# Patient Record
Sex: Female | Born: 1965
Health system: Southern US, Community
[De-identification: ages and names within clinical notes are randomized; demographics above are authoritative.]

## PROBLEM LIST (undated history)

## (undated) DIAGNOSIS — Z90710 Acquired absence of both cervix and uterus: Secondary | ICD-10-CM

## (undated) DIAGNOSIS — Z9882 Breast implant status: Secondary | ICD-10-CM

## (undated) DIAGNOSIS — I10 Essential (primary) hypertension: Secondary | ICD-10-CM

## (undated) DIAGNOSIS — M5030 Other cervical disc degeneration, unspecified cervical region: Secondary | ICD-10-CM

## (undated) DIAGNOSIS — F419 Anxiety disorder, unspecified: Secondary | ICD-10-CM

## (undated) HISTORY — DX: Acquired absence of both cervix and uterus: Z90.710

## (undated) HISTORY — DX: Breast implant status: Z98.82

## (undated) HISTORY — DX: Anxiety disorder, unspecified: F41.9

## (undated) HISTORY — PX: CERVICAL FUSION: SHX112

## (undated) HISTORY — PX: ABDOMINAL HYSTERECTOMY: SHX81

## (undated) HISTORY — PX: BREAST ENHANCEMENT SURGERY: SHX7

## (undated) HISTORY — PX: ELBOW SURGERY: SHX618

---

## 2002-08-22 HISTORY — PX: AUGMENTATION MAMMAPLASTY: SUR837

## 2007-01-28 ENCOUNTER — Emergency Department: Payer: Self-pay | Admitting: Emergency Medicine

## 2008-11-26 ENCOUNTER — Ambulatory Visit: Payer: Self-pay | Admitting: Internal Medicine

## 2009-06-18 ENCOUNTER — Emergency Department: Payer: Self-pay | Admitting: Emergency Medicine

## 2010-06-29 ENCOUNTER — Emergency Department: Payer: Self-pay | Admitting: Emergency Medicine

## 2010-07-27 ENCOUNTER — Emergency Department: Payer: Self-pay | Admitting: Emergency Medicine

## 2010-11-17 ENCOUNTER — Ambulatory Visit: Payer: Self-pay | Admitting: Family Medicine

## 2011-01-25 ENCOUNTER — Emergency Department: Payer: Self-pay | Admitting: Emergency Medicine

## 2011-08-25 ENCOUNTER — Ambulatory Visit: Payer: Self-pay | Admitting: Internal Medicine

## 2011-10-07 ENCOUNTER — Ambulatory Visit: Payer: Self-pay | Admitting: Internal Medicine

## 2011-10-07 LAB — URINALYSIS, COMPLETE
Bacteria: NEGATIVE
Blood: NEGATIVE
Glucose,UR: NEGATIVE mg/dL (ref 0–75)
Leukocyte Esterase: NEGATIVE
Nitrite: NEGATIVE
Specific Gravity: 1.005 (ref 1.003–1.030)

## 2011-10-12 ENCOUNTER — Ambulatory Visit: Payer: Self-pay | Admitting: Internal Medicine

## 2012-02-03 DIAGNOSIS — M549 Dorsalgia, unspecified: Secondary | ICD-10-CM | POA: Insufficient documentation

## 2012-02-03 DIAGNOSIS — M542 Cervicalgia: Secondary | ICD-10-CM | POA: Insufficient documentation

## 2012-02-03 DIAGNOSIS — R200 Anesthesia of skin: Secondary | ICD-10-CM | POA: Insufficient documentation

## 2012-04-04 DIAGNOSIS — M4802 Spinal stenosis, cervical region: Secondary | ICD-10-CM | POA: Insufficient documentation

## 2012-04-04 DIAGNOSIS — I10 Essential (primary) hypertension: Secondary | ICD-10-CM | POA: Insufficient documentation

## 2013-01-07 ENCOUNTER — Emergency Department: Payer: Self-pay | Admitting: Unknown Physician Specialty

## 2013-01-07 LAB — CBC
HCT: 37.3 % (ref 35.0–47.0)
HGB: 12.7 g/dL (ref 12.0–16.0)
MCH: 30.7 pg (ref 26.0–34.0)
MCV: 90 fL (ref 80–100)
Platelet: 392 10*3/uL (ref 150–440)
RBC: 4.15 10*6/uL (ref 3.80–5.20)
WBC: 11.6 10*3/uL — ABNORMAL HIGH (ref 3.6–11.0)

## 2013-01-07 LAB — BASIC METABOLIC PANEL
Anion Gap: 4 — ABNORMAL LOW (ref 7–16)
Calcium, Total: 8.8 mg/dL (ref 8.5–10.1)
Chloride: 108 mmol/L — ABNORMAL HIGH (ref 98–107)
Co2: 27 mmol/L (ref 21–32)
Creatinine: 0.73 mg/dL (ref 0.60–1.30)
EGFR (African American): >60
EGFR (Non-African Amer.): >60
Osmolality: 276 (ref 275–301)
Potassium: 4.3 mmol/L (ref 3.5–5.1)
Sodium: 139 mmol/L (ref 136–145)

## 2013-01-07 LAB — CK TOTAL AND CKMB (NOT AT ARMC): CK, Total: 40 U/L (ref 21–215)

## 2013-10-27 ENCOUNTER — Emergency Department: Payer: Self-pay | Admitting: Emergency Medicine

## 2013-10-27 LAB — COMPREHENSIVE METABOLIC PANEL
ALK PHOS: 54 U/L
ALT: 12 U/L (ref 12–78)
ANION GAP: 6 — AB (ref 7–16)
AST: 9 U/L — AB (ref 15–37)
Albumin: 3.6 g/dL (ref 3.4–5.0)
BILIRUBIN TOTAL: 0.3 mg/dL (ref 0.2–1.0)
BUN: 6 mg/dL — ABNORMAL LOW (ref 7–18)
CALCIUM: 8.3 mg/dL — AB (ref 8.5–10.1)
CHLORIDE: 101 mmol/L (ref 98–107)
Co2: 25 mmol/L (ref 21–32)
Creatinine: 0.62 mg/dL (ref 0.60–1.30)
GLUCOSE: 92 mg/dL (ref 65–99)
Osmolality: 262 (ref 275–301)
Potassium: 3.5 mmol/L (ref 3.5–5.1)
Sodium: 132 mmol/L — ABNORMAL LOW (ref 136–145)
Total Protein: 6.8 g/dL (ref 6.4–8.2)

## 2013-10-27 LAB — CBC
HCT: 37.4 % (ref 35.0–47.0)
HGB: 12.3 g/dL (ref 12.0–16.0)
MCH: 30.4 pg (ref 26.0–34.0)
MCHC: 32.8 g/dL (ref 32.0–36.0)
MCV: 93 fL (ref 80–100)
Platelet: 307 10*3/uL (ref 150–440)
RBC: 4.04 10*6/uL (ref 3.80–5.20)
RDW: 12.8 % (ref 11.5–14.5)
WBC: 13.4 10*3/uL — ABNORMAL HIGH (ref 3.6–11.0)

## 2013-10-27 LAB — URINALYSIS, COMPLETE
Bilirubin,UR: NEGATIVE
Glucose,UR: NEGATIVE mg/dL (ref 0–75)
Ketone: NEGATIVE
Nitrite: NEGATIVE
PROTEIN: NEGATIVE
Ph: 6 (ref 4.5–8.0)
Specific Gravity: 1.002 (ref 1.003–1.030)
Squamous Epithelial: 1
WBC UR: 69 /HPF (ref 0–5)

## 2015-04-06 ENCOUNTER — Ambulatory Visit: Payer: BLUE CROSS/BLUE SHIELD

## 2015-04-06 ENCOUNTER — Encounter: Payer: Self-pay | Admitting: Emergency Medicine

## 2015-04-06 ENCOUNTER — Ambulatory Visit
Admission: EM | Admit: 2015-04-06 | Discharge: 2015-04-06 | Disposition: A | Discharge status: Home / Self Care | Payer: BLUE CROSS/BLUE SHIELD | Attending: Family Medicine | Admitting: Family Medicine

## 2015-04-06 DIAGNOSIS — F17210 Nicotine dependence, cigarettes, uncomplicated: Secondary | ICD-10-CM | POA: Diagnosis not present

## 2015-04-06 DIAGNOSIS — R109 Unspecified abdominal pain: Secondary | ICD-10-CM | POA: Diagnosis present

## 2015-04-06 DIAGNOSIS — I10 Essential (primary) hypertension: Secondary | ICD-10-CM | POA: Diagnosis not present

## 2015-04-06 DIAGNOSIS — R1032 Left lower quadrant pain: Secondary | ICD-10-CM | POA: Diagnosis not present

## 2015-04-06 DIAGNOSIS — R1031 Right lower quadrant pain: Secondary | ICD-10-CM | POA: Diagnosis not present

## 2015-04-06 DIAGNOSIS — R1030 Lower abdominal pain, unspecified: Secondary | ICD-10-CM | POA: Diagnosis not present

## 2015-04-06 HISTORY — DX: Essential (primary) hypertension: I10

## 2015-04-06 HISTORY — DX: Other cervical disc degeneration, unspecified cervical region: M50.30

## 2015-04-06 LAB — URINALYSIS COMPLETE WITH MICROSCOPIC (ARMC ONLY)
BACTERIA UA: NONE SEEN — AB
BILIRUBIN URINE: NEGATIVE
GLUCOSE, UA: NEGATIVE mg/dL
Ketones, ur: NEGATIVE mg/dL
Leukocytes, UA: NEGATIVE
Nitrite: NEGATIVE
PH: 6 (ref 5.0–8.0)
Protein, ur: NEGATIVE mg/dL
Specific Gravity, Urine: <1.005 — ABNORMAL LOW (ref 1.005–1.030)

## 2015-04-06 LAB — COMPREHENSIVE METABOLIC PANEL
ALBUMIN: 3.9 g/dL (ref 3.5–5.0)
ALK PHOS: 57 U/L (ref 38–126)
ALT: 11 U/L — AB (ref 14–54)
AST: 14 U/L — AB (ref 15–41)
Anion gap: 7 (ref 5–15)
BUN: 7 mg/dL (ref 6–20)
CALCIUM: 8.7 mg/dL — AB (ref 8.9–10.3)
CHLORIDE: 105 mmol/L (ref 101–111)
CO2: 25 mmol/L (ref 22–32)
CREATININE: 0.65 mg/dL (ref 0.44–1.00)
GFR calc Af Amer: >60 mL/min (ref >60)
GFR calc non Af Amer: >60 mL/min (ref >60)
GLUCOSE: 100 mg/dL — AB (ref 65–99)
Potassium: 3.7 mmol/L (ref 3.5–5.1)
SODIUM: 137 mmol/L (ref 135–145)
Total Bilirubin: 0.4 mg/dL (ref 0.3–1.2)
Total Protein: 6.8 g/dL (ref 6.5–8.1)

## 2015-04-06 LAB — CBC WITH DIFFERENTIAL/PLATELET
BASOS PCT: 1 %
Basophils Absolute: 0.1 10*3/uL (ref 0–0.1)
EOS ABS: 0 10*3/uL (ref 0–0.7)
Eosinophils Relative: 0 %
HEMATOCRIT: 35.7 % (ref 35.0–47.0)
HEMOGLOBIN: 12.1 g/dL (ref 12.0–16.0)
Lymphocytes Relative: 20 %
Lymphs Abs: 2.1 10*3/uL (ref 1.0–3.6)
MCH: 30.8 pg (ref 26.0–34.0)
MCHC: 33.8 g/dL (ref 32.0–36.0)
MCV: 91 fL (ref 80.0–100.0)
MONOS PCT: 6 %
Monocytes Absolute: 0.6 10*3/uL (ref 0.2–0.9)
NEUTROS ABS: 7.6 10*3/uL — AB (ref 1.4–6.5)
NEUTROS PCT: 73 %
Platelets: 306 10*3/uL (ref 150–440)
RBC: 3.92 MIL/uL (ref 3.80–5.20)
RDW: 13 % (ref 11.5–14.5)
WBC: 10.4 10*3/uL (ref 3.6–11.0)

## 2015-04-06 LAB — SEDIMENTATION RATE: SED RATE: 13 mm/h (ref 0–20)

## 2015-04-06 NOTE — ED Notes (Signed)
Pt states that she has had abdominal pain for 3 days

## 2015-04-06 NOTE — ED Notes (Signed)
Pt was discharged by provider.

## 2015-04-07 ENCOUNTER — Encounter: Payer: Self-pay | Admitting: Physician Assistant

## 2015-04-07 ENCOUNTER — Ambulatory Visit: Payer: Self-pay | Admitting: Family Medicine

## 2015-04-07 ENCOUNTER — Other Ambulatory Visit: Payer: Self-pay

## 2015-04-07 NOTE — ED Provider Notes (Signed)
CSN: [id number]     Arrival date & time 04/06/15  1622 History   First MD Initiated Contact with Patient 04/06/15 1721     Chief Complaint  Patient presents with  . Abdominal Pain   (Consider location/radiation/quality/duration/timing/severity/associated sxs/prior Treatment) HPI  49 yo F presents reporting a few days of waxing and waning bilateral lower quadrant discomfort.  No fever No nausea or vomiting. She reports constipation and uses of softeners as well as laxatives. Also concerned about a few soft bowel movements.  Recent increase  in fresh fruits and vegetables, particularly strawberries and tomatoes. Denies any past history of bowel diagnosis such as diverticulosis. However in past history had VH with same day discharge about 10 years ago; developed acute abdomen overnight and was re-admitted and explored with active bleeder identified. Recovery unremarkable however at her 6 week postop check she was noted to have a knuckle of bowel visible in her vaginal cuff. She was directly admitted and operated on , states she spent 6 days in intensive care and 1-2 on ward then discharged. Has had no additional complication except she gets abdominal cramping and gas with many foods. Has not had fever or malaise, but intermittent pain which was moderately uncomfortable yesterday and present this morning so decided to come in.  Patient reports she is on in Pain Clinic because of her back issues and will defer pain medications. Her presentation is of mild, moderate intermittent distress which goes away when supine. Hx of 2012 ant cervical discectomy and cadaver bone fusion; 2014 right elbow surgery had to be done twice Smokes 1ppd for many years  Failed to take her blood pressure medicine today- and may have forgotten yesterday as well. Encourgaed to put bottle in obvious place like in coffee cup for daily reminder    Past Medical History  Diagnosis Date  . Hypertension   . DDD (degenerative  disc disease), cervical    Past Surgical History  Procedure Laterality Date  . Cervical fusion    . Abdominal hysterectomy     History reviewed. No pertinent family history. Social History  Substance Use Topics  . Smoking status: Current Every Day Smoker -- 1.00 packs/day    Types: Cigarettes  . Smokeless tobacco: None  . Alcohol Use: No   OB History    No data available     Review of Systems  Constitutional -afebrile Eyes-denies visual changes ENT- normal voice,denies sore throat CV-denies chest pain Resp-denies SOB GI- negative for nausea,vomiting, diarrhea- bilateral wax/wane abdominal crampingx 2-3 days GU- negative for dysuria, frequency MSK- negative for back pain, ambulatory Skin- denies acute changes Neuro- negative headache,focal weakness or numbness  Allergies  Esmolol; Latex; Bacitracin; and Sulfa antibiotics  Home Medications   Prior to Admission medications   Medication Sig Start Date End Date Taking? Authorizing Provider  lisinopril (PRINIVIL,ZESTRIL) 10 MG tablet Take 10 mg by mouth daily.   Yes Historical Provider, MD  oxyCODONE-acetaminophen (PERCOCET) 7.5-325 MG per tablet Take 1 tablet by mouth every 4 (four) hours as needed for severe pain.   Yes Historical Provider, MD  oxyCODONE-acetaminophen (PERCOCET/ROXICET) 5-325 MG per tablet Take by mouth every 4 (four) hours as needed for severe pain.   Yes Historical Provider, MD  cyclobenzaprine (FLEXERIL) 10 MG tablet TAKE 1 TABLET(S) TWICE A DAY BY ORAL ROUTE. 03/05/15   Historical Provider, MD  LYRICA 50 MG capsule Take 50 mg by mouth at bedtime. 01/15/15   Historical Provider, MD   BP 149/105 mmHg  Pulse  98  Temp(Src) 98.2 F (36.8 C) (Oral)  Resp 16  SpO2 100% Physical Exam   Constitutional -alert and oriented,well appearing, complaining of peri-umbilical and RLQ , LLQ pain.  Head-atraumatic, normocephalic Eyes- conjunctiva normal, EOMI ,conjugate gaze Nose- no congestion or  rhinorrhea Mouth/throat- mucous membranes moist ,oropharynx non-erythematous Neck- supple without glandular enlargement CV- regular rate, grossly normal heart sounds,  Resp-no distress, normal respiratory effort,clear to auscultation bilaterally GI- soft,no distention, no organomegaly or mass identified, positive BS, c/o pain in bilateral lower quadrants GU- unremarkable/ not examined, no dysuria or frquency MSK- no tender, normal ROM, all extremities, ambulatory, self-care Neuro- normal speech and language, no gross focal neurological deficit appreciated, no gait instability, Skin-warm,dry ,intact; no rash noted Psych-mood and affect grossly normal; speech and behavior grossly normal   ED Course  Procedures (including critical care time) Labs Review Labs Reviewed  URINALYSIS COMPLETEWITH MICROSCOPIC ([facility] ONLY) - Abnormal; Notable for the following:    Color, Urine STRAW (*)    Specific Gravity, Urine <1.005 (*)    Hgb urine dipstick 1+ (*)    Bacteria, UA NONE SEEN (*)    Squamous Epithelial / LPF 0-5 (*)    All other components within normal limits  CBC WITH DIFFERENTIAL/PLATELET - Abnormal; Notable for the following:    Neutro Abs 7.6 (*)    All other components within normal limits  COMPREHENSIVE METABOLIC PANEL - Abnormal; Notable for the following:    Glucose, Bld 100 (*)    Calcium 8.7 (*)    AST 14 (*)    ALT 11 (*)    All other components within normal limits  SEDIMENTATION RATE    Imaging Review Dg Abd 2 Views  04/06/2015   CLINICAL DATA:  Acute onset of lower abdominal pain for 3 days. Initial encounter.  EXAM: ABDOMEN - 2 VIEW  COMPARISON:  MRI of the lumbar spine performed 10/12/2011, and CT of the abdomen and pelvis from 06/18/2009  FINDINGS: The visualized bowel gas pattern is unremarkable. Scattered air and stool filled loops of colon are seen; no abnormal dilatation of small bowel loops is seen to suggest small bowel obstruction. No free intra-abdominal air is  identified, though evaluation for free air is limited on a single supine view.  The visualized osseous structures are within normal limits; the sacroiliac joints are unremarkable in appearance. The visualized lung bases are essentially clear.  IMPRESSION: Unremarkable bowel gas pattern; no free intra-abdominal air seen. Small amount of stool noted in the colon.   Electronically Signed   By: Roanna Raider M.D.   On: 04/06/2015 18:52   Patient was revisited multiple times while baseline labs were discussed . She was never acutely uncomfortable but was concerned and a bit anxious. When I was able to present to her that the films were negative for a current acute process she revealed that she had been frightened by a news report.She does a lot of outdoor grilling and a news show covered a young man with an acute abdomen that was found to be related to the consumption of a single bristle from the steel brush used to clean his own grill. She had owned the same grill brush until destroyed yesterday. Remarkably improved .  Blood pressure reviewed again and she is scheduled to see her PCP tomorrow. Will review. Denies headache or visual changes, feels much better..  MDM   1. Bilateral lower abdominal discomfort    Plan: 1. Test/x-ray results and diagnosis reviewed with patient 2. Take  BP Rx as per orders; reviewed with patient 3. Recommend supportive treatment with daily fiber bulking stool softener. Consider colonoscopy  4. F/u prn if symptoms worsen or don't improve     Rae Halsted, PA-C 04/07/15 2026

## 2015-06-27 ENCOUNTER — Ambulatory Visit
Admission: EM | Admit: 2015-06-27 | Discharge: 2015-06-27 | Disposition: A | Discharge status: Home / Self Care | Payer: BLUE CROSS/BLUE SHIELD | Attending: Family Medicine | Admitting: Family Medicine

## 2015-06-27 DIAGNOSIS — N390 Urinary tract infection, site not specified: Secondary | ICD-10-CM

## 2015-06-27 LAB — URINALYSIS COMPLETE WITH MICROSCOPIC (ARMC ONLY)
BILIRUBIN URINE: NEGATIVE
Glucose, UA: NEGATIVE mg/dL
Ketones, ur: NEGATIVE mg/dL
NITRITE: NEGATIVE
Protein, ur: NEGATIVE mg/dL
Specific Gravity, Urine: 1.01 (ref 1.005–1.030)
pH: 6.5 (ref 5.0–8.0)

## 2015-06-27 MED ORDER — CIPROFLOXACIN HCL 500 MG PO TABS: 500.0000 mg | ORAL_TABLET | Freq: Two times a day (BID) | ORAL | Status: DC

## 2015-06-27 NOTE — ED Provider Notes (Signed)
CSN: [id number]     Arrival date & time 06/27/15  0803 History   First MD Initiated Contact with Patient 06/27/15 [phone]     Chief Complaint  Patient presents with  . Urinary Tract Infection   (Consider location/radiation/quality/duration/timing/severity/associated sxs/prior Treatment) Patient is a 49 y.o. female presenting with dysuria. The history is provided by the patient.  Dysuria Pain quality:  Burning and sharp Pain severity:  Mild Onset quality:  Sudden Timing:  Constant Progression:  Worsening Chronicity:  New Recent urinary tract infections: no   Relieved by:  Nothing Urinary symptoms: discolored urine, frequent urination and hesitancy   Associated symptoms: no abdominal pain, no fever, no flank pain, no nausea and no vomiting     Past Medical History  Diagnosis Date  . Hypertension   . DDD (degenerative disc disease), cervical    Past Surgical History  Procedure Laterality Date  . Cervical fusion    . Abdominal hysterectomy     No family history on file. Social History  Substance Use Topics  . Smoking status: Current Every Day Smoker -- 1.00 packs/day    Types: Cigarettes  . Smokeless tobacco: Not on file  . Alcohol Use: No   OB History    No data available     Review of Systems  Constitutional: Negative for fever.  Gastrointestinal: Negative for nausea, vomiting and abdominal pain.  Genitourinary: Positive for dysuria. Negative for flank pain.    Allergies  Esmolol; Latex; Bacitracin; and Sulfa antibiotics  Home Medications   Prior to Admission medications   Medication Sig Start Date End Date Taking? Authorizing Provider  cyclobenzaprine (FLEXERIL) 10 MG tablet TAKE 1 TABLET(S) TWICE A DAY BY ORAL ROUTE. 03/05/15  Yes Historical Provider, MD  lisinopril (PRINIVIL,ZESTRIL) 10 MG tablet Take 10 mg by mouth daily.   Yes Historical Provider, MD  oxyCODONE-acetaminophen (PERCOCET) 7.5-325 MG per tablet Take 1 tablet by mouth every 4 (four) hours as  needed for severe pain.   Yes Historical Provider, MD  ciprofloxacin (CIPRO) 500 MG tablet Take 1 tablet (500 mg total) by mouth every 12 (twelve) hours. 06/27/15   Payton Mccallumrlando Pal Shell, MD  LYRICA 50 MG capsule Take 50 mg by mouth at bedtime. 01/15/15   Historical Provider, MD  oxyCODONE-acetaminophen (PERCOCET/ROXICET) 5-325 MG per tablet Take by mouth every 4 (four) hours as needed for severe pain.    Historical Provider, MD   Meds Ordered and Administered this Visit  Medications - No data to display  BP 153/115 mmHg  Pulse 91  Temp(Src) 97.5 F (36.4 C) (Oral)  Ht 5\' 4"  (1.626 m)  Wt 153 lb 9.6 oz (69.673 kg)  BMI 26.35 kg/m2  SpO2 100% No data found.   Physical Exam  Constitutional: She appears well-developed and well-nourished. No distress.  Abdominal: Soft. Bowel sounds are normal. She exhibits no distension and no mass. There is tenderness (mild suprapubic). There is no rebound and no guarding.  Skin: She is not diaphoretic.  Nursing note and vitals reviewed.   ED Course  Procedures (including critical care time)  Labs Review Labs Reviewed  URINALYSIS COMPLETEWITH MICROSCOPIC ([facility] ONLY) - Abnormal; Notable for the following:    Color, Urine STRAW (*)    Hgb urine dipstick 1+ (*)    Leukocytes, UA 1+ (*)    Squamous Epithelial / LPF 0-5 (*)    All other components within normal limits  URINE CULTURE    Imaging Review No results found.   Visual Acuity Review  Right  Eye Distance:   Left Eye Distance:   Bilateral Distance:    Right Eye Near:   Left Eye Near:    Bilateral Near:         MDM   1. UTI (lower urinary tract infection)    Discharge Medication List as of 06/27/2015  8:45 AM    START taking these medications   Details  ciprofloxacin (CIPRO) 500 MG tablet Take 1 tablet (500 mg total) by mouth every 12 (twelve) hours., Starting 06/27/2015, Until Discontinued, Normal      1. Lab result and diagnosis reviewed with patient 2. rx as per orders above;  reviewed possible side effects, interactions, risks and benefits  3. Recommend supportive treatment with increased fluids 4. Follow-up prn if symptoms worsen or don't improve    Payton Guynes, MD 06/27/15 [phone]

## 2015-06-27 NOTE — ED Notes (Signed)
Patient c/o urgency and frequency starting last night. Burning with urination and strong odor. Has had UTI before.

## 2015-06-28 LAB — URINE CULTURE

## 2015-06-29 NOTE — ED Notes (Signed)
Final report of urine C&S shows insignificant growth 

## 2015-07-10 ENCOUNTER — Other Ambulatory Visit: Payer: Self-pay | Admitting: Internal Medicine

## 2015-07-13 ENCOUNTER — Encounter: Payer: Self-pay | Admitting: Internal Medicine

## 2015-07-13 DIAGNOSIS — E782 Mixed hyperlipidemia: Secondary | ICD-10-CM | POA: Insufficient documentation

## 2015-07-13 DIAGNOSIS — Z720 Tobacco use: Secondary | ICD-10-CM | POA: Insufficient documentation

## 2015-07-29 ENCOUNTER — Ambulatory Visit (INDEPENDENT_AMBULATORY_CARE_PROVIDER_SITE_OTHER): Payer: BLUE CROSS/BLUE SHIELD | Admitting: Internal Medicine

## 2015-07-29 ENCOUNTER — Encounter: Payer: Self-pay | Admitting: Internal Medicine

## 2015-07-29 VITALS — BP 144/84 | HR 100 | Ht 64.0 in | Wt 151.6 lb

## 2015-07-29 DIAGNOSIS — Z23 Encounter for immunization: Secondary | ICD-10-CM | POA: Diagnosis not present

## 2015-07-29 DIAGNOSIS — I10 Essential (primary) hypertension: Secondary | ICD-10-CM

## 2015-07-29 MED ORDER — LISINOPRIL 20 MG PO TABS: 20.0000 mg | ORAL_TABLET | Freq: Every day | ORAL | Status: DC

## 2015-07-29 NOTE — Progress Notes (Signed)
Date:  07/29/2015   Name:  Phyllis Murphy   DOB:  Apr 07, 1966   MRN:  [id number]   Chief Complaint: Hypertension Hypertension This is a chronic problem. The current episode started more than 1 year ago. The problem has been gradually worsening since onset. The problem is uncontrolled. Associated symptoms include headaches. Pertinent negatives include no chest pain, palpitations or shortness of breath.   at several different times and she's been sick or when she's been under pain clinic appointment her blood pressures have been elevated. She continues on lisinopril 10 mg but believes that is just not controlling her now. She denies the use of high doses of nonsteroidals or sinus or diet medication. When her blood pressures elevated she does feel flushed and gets a slight headache. She does not have a cuff to measure blood pressures at home.  Review of Systems  Constitutional: Negative for fever.  HENT: Negative for tinnitus.   Respiratory: Negative for chest tightness, shortness of breath and wheezing.   Cardiovascular: Negative for chest pain, palpitations and leg swelling.  Musculoskeletal: Positive for back pain.  Neurological: Positive for headaches. Negative for dizziness, seizures, weakness and light-headedness.    Patient Active Problem List   Diagnosis Date Noted  . Mixed hyperlipidemia 07/13/2015  . Tobacco use disorder 07/13/2015  . Cervical spinal stenosis 04/04/2012  . Essential hypertension 04/04/2012  . Back ache 02/03/2012  . Hand numbness 02/03/2012  . Cervical pain 02/03/2012    Prior to Admission medications   Medication Sig Start Date End Date Taking? Authorizing Provider  cyclobenzaprine (FLEXERIL) 10 MG tablet TAKE 1 TABLET(S) TWICE A DAY BY ORAL ROUTE. 03/05/15  Yes Historical Provider, MD  lisinopril (PRINIVIL,ZESTRIL) 10 MG tablet TAKE 1 TABLET BY MOUTH  DAILY 07/10/15  Yes Reubin MilanLaura H Lilianah Buffin, MD  oxyCODONE-acetaminophen (PERCOCET) 7.5-325 MG per tablet Take 1  tablet by mouth every 4 (four) hours as needed for severe pain.   Yes Historical Provider, MD  oxyCODONE-acetaminophen (PERCOCET/ROXICET) 5-325 MG per tablet Take by mouth every 4 (four) hours as needed for severe pain.   Yes Historical Provider, MD    Allergies  Allergen Reactions  . Esmolol Anaphylaxis  . Latex Anaphylaxis  . Bacitracin Hives and Itching  . Sulfa Antibiotics Hives and Itching    Past Surgical History  Procedure Laterality Date  . Cervical fusion    . Abdominal hysterectomy    . Breast enhancement surgery      Social History  Substance Use Topics  . Smoking status: Current Every Day Smoker -- 1.00 packs/day    Types: Cigarettes  . Smokeless tobacco: None  . Alcohol Use: No     Medication list has been reviewed and updated.   Physical Exam  Constitutional: She is oriented to person, place, and time. She appears well-developed. No distress.  HENT:  Head: Normocephalic and atraumatic.  Eyes: Right eye exhibits no discharge. Left eye exhibits no discharge. No scleral icterus.  Neck: Normal range of motion. Neck supple. No thyromegaly present.  Cardiovascular: Normal rate, regular rhythm and normal heart sounds.   Pulmonary/Chest: Effort normal and breath sounds normal. No respiratory distress. She has no wheezes.  Musculoskeletal: Normal range of motion. She exhibits no edema or tenderness.  Neurological: She is alert and oriented to person, place, and time.  Skin: Skin is warm and dry. No rash noted.  Psychiatric: She has a normal mood and affect. Her behavior is normal. Thought content normal.    BP 144/84 mmHg  Pulse 100  Ht  (1.626 m)  Wt 151 lb 9.6 oz (68.765 kg)  BMI 26.01 kg/m2  Assessment and Plan: 1. Essential hypertension Needs additional control; increase lisinopril to 20 mg Follow-up in 6 weeks - lisinopril (PRINIVIL,ZESTRIL) 20 MG tablet; Take 1 tablet (20 mg total) by mouth daily.  Dispense: 30 tablet; Refill: 1  2. Need for  influenza vaccination - Flu Vaccine QUAD 36+ mos IM   Bari Edward, MD [facility]  07/29/2015

## 2015-08-06 ENCOUNTER — Encounter: Payer: Self-pay | Admitting: Internal Medicine

## 2015-08-06 ENCOUNTER — Ambulatory Visit (INDEPENDENT_AMBULATORY_CARE_PROVIDER_SITE_OTHER): Payer: BLUE CROSS/BLUE SHIELD | Admitting: Internal Medicine

## 2015-08-06 VITALS — BP 110/84 | HR 96 | Ht 64.0 in | Wt 151.8 lb

## 2015-08-06 DIAGNOSIS — N3001 Acute cystitis with hematuria: Secondary | ICD-10-CM

## 2015-08-06 DIAGNOSIS — R300 Dysuria: Secondary | ICD-10-CM

## 2015-08-06 LAB — POC URINALYSIS WITH MICROSCOPIC (NON AUTO)MANUAL RESULT
Bilirubin, UA: NEGATIVE
Crystals: 0
GLUCOSE UA: NEGATIVE
Ketones, UA: NEGATIVE
Leukocytes, UA: NEGATIVE
MUCUS UA: 0
NITRITE UA: NEGATIVE
PROTEIN UA: NEGATIVE
RBC: 2 M/uL — AB (ref 4.04–5.48)
Spec Grav, UA: 1.01
UROBILINOGEN UA: 0.2
WBC Casts, UA: 5
pH, UA: 5

## 2015-08-06 LAB — POCT URINALYSIS DIPSTICK
BILIRUBIN UA: NEGATIVE
Glucose, UA: NEGATIVE
KETONES UA: NEGATIVE
LEUKOCYTES UA: NEGATIVE
NITRITE UA: NEGATIVE
PROTEIN UA: NEGATIVE
Spec Grav, UA: <=1.005
Urobilinogen, UA: 0.2
pH, UA: 5

## 2015-08-06 MED ORDER — NITROFURANTOIN MONOHYD MACRO 100 MG PO CAPS: 100.0000 mg | ORAL_CAPSULE | Freq: Two times a day (BID) | ORAL | Status: DC

## 2015-08-06 NOTE — Progress Notes (Signed)
Date:  08/06/2015   Name:  Phyllis ChromanDavona L Murphy   DOB:  1966/01/08   MRN:  [id number]   Chief Complaint: Urinary Tract Infection Urinary Tract Infection  This is a new problem. The current episode started yesterday. The patient is experiencing no pain. There has been no fever. She is sexually active (gets UTIs after intercourse ). There is no history of pyelonephritis. Associated symptoms include frequency and urgency. Pertinent negatives include no chills, flank pain, hematuria, hesitancy, nausea or vomiting. She has tried nothing for the symptoms.  She recently has intercourse and she believes this triggered the symptoms.    Review of Systems  Constitutional: Negative for chills.  Gastrointestinal: Negative for nausea, vomiting and abdominal pain.  Genitourinary: Positive for urgency and frequency. Negative for hesitancy, hematuria, flank pain, vaginal discharge, difficulty urinating, vaginal pain, pelvic pain and dyspareunia.    Patient Active Problem List   Diagnosis Date Noted  . Mixed hyperlipidemia 07/13/2015  . Tobacco use disorder 07/13/2015  . Cervical spinal stenosis 04/04/2012  . Essential hypertension 04/04/2012  . Back ache 02/03/2012  . Hand numbness 02/03/2012  . Cervical pain 02/03/2012    Prior to Admission medications   Medication Sig Start Date End Date Taking? Authorizing Provider  cyclobenzaprine (FLEXERIL) 10 MG tablet TAKE 1 TABLET(S) TWICE A DAY BY ORAL ROUTE. 03/05/15   Historical Provider, MD  lisinopril (PRINIVIL,ZESTRIL) 20 MG tablet Take 1 tablet (20 mg total) by mouth daily. 07/29/15   Reubin MilanLaura H Berglund, MD  oxyCODONE-acetaminophen (PERCOCET) 7.5-325 MG per tablet Take 1 tablet by mouth every 4 (four) hours as needed for severe pain.    Historical Provider, MD  oxyCODONE-acetaminophen (PERCOCET/ROXICET) 5-325 MG per tablet Take by mouth every 4 (four) hours as needed for severe pain.    Historical Provider, MD    Allergies  Allergen Reactions  . Esmolol  Anaphylaxis  . Latex Anaphylaxis  . Bacitracin Hives and Itching  . Sulfa Antibiotics Hives and Itching    Past Surgical History  Procedure Laterality Date  . Cervical fusion    . Abdominal hysterectomy    . Breast enhancement surgery      Social History  Substance Use Topics  . Smoking status: Current Every Day Smoker -- 1.00 packs/day    Types: Cigarettes  . Smokeless tobacco: None  . Alcohol Use: No    Medication list has been reviewed and updated.   Physical Exam  Constitutional: She appears well-developed and well-nourished. No distress.  Cardiovascular: Normal rate, regular rhythm and normal heart sounds.   Pulmonary/Chest: Effort normal and breath sounds normal.  Abdominal: Normal appearance. There is no tenderness. There is no CVA tenderness.  Nursing note and vitals reviewed.   BP 110/84 mmHg  Pulse 96  Ht 5\' 4"  (1.626 m)  Wt 151 lb 12.8 oz (68.856 kg)  BMI 26.04 kg/m2  Assessment and Plan: 1. Dysuria - POCT urinalysis dipstick - POC urinalysis w microscopic (non auto)  2. Acute cystitis with hematuria Use nitrofurantoin for one week then use a single dose after intercourse to prevent UTI - nitrofurantoin, macrocrystal-monohydrate, (MACROBID) 100 MG capsule; Take 1 capsule (100 mg total) by mouth 2 (two) times daily.  Dispense: 28 capsule; Refill: 0   Bari EdwardLaura Berglund, MD [facility]  08/06/2015

## 2015-08-06 NOTE — Patient Instructions (Addendum)
Take Nitrofurantoin twice a day for one week, then use a single dose as needed after intercourse.     Urinary Tract Infection Urinary tract infections (UTIs) can develop anywhere along your urinary tract. Your urinary tract is your body's drainage system for removing wastes and extra water. Your urinary tract includes two kidneys, two ureters, a bladder, and a urethra. Your kidneys are a pair of bean-shaped organs. Each kidney is about the size of your fist. They are located below your ribs, one on each side of your spine. CAUSES Infections are caused by microbes, which are microscopic organisms, including fungi, viruses, and bacteria. These organisms are so small that they can only be seen through a microscope. Bacteria are the microbes that most commonly cause UTIs. SYMPTOMS  Symptoms of UTIs may vary by age and gender of the patient and by the location of the infection. Symptoms in young women typically include a frequent and intense urge to urinate and a painful, burning feeling in the bladder or urethra during urination. Older women and men are more likely to be tired, shaky, and weak and have muscle aches and abdominal pain. A fever may mean the infection is in your kidneys. Other symptoms of a kidney infection include pain in your back or sides below the ribs, nausea, and vomiting. DIAGNOSIS To diagnose a UTI, your caregiver will ask you about your symptoms. Your caregiver will also ask you to provide a urine sample. The urine sample will be tested for bacteria and white blood cells. White blood cells are made by your body to help fight infection. TREATMENT  Typically, UTIs can be treated with medication. Because most UTIs are caused by a bacterial infection, they usually can be treated with the use of antibiotics. The choice of antibiotic and length of treatment depend on your symptoms and the type of bacteria causing your infection. HOME CARE INSTRUCTIONS  If you were prescribed antibiotics,  take them exactly as your caregiver instructs you. Finish the medication even if you feel better after you have only taken some of the medication.  Drink enough water and fluids to keep your urine clear or pale yellow.  Avoid caffeine, tea, and carbonated beverages. They tend to irritate your bladder.  Empty your bladder often. Avoid holding urine for long periods of time.  Empty your bladder before and after sexual intercourse.  After a bowel movement, women should cleanse from front to back. Use each tissue only once. SEEK MEDICAL CARE IF:   You have back pain.  You develop a fever.  Your symptoms do not begin to resolve within 3 days. SEEK IMMEDIATE MEDICAL CARE IF:   You have severe back pain or lower abdominal pain.  You develop chills.  You have nausea or vomiting.  You have continued burning or discomfort with urination. MAKE SURE YOU:   Understand these instructions.  Will watch your condition.  Will get help right away if you are not doing well or get worse.   This information is not intended to replace advice given to you by your health care provider. Make sure you discuss any questions you have with your health care provider.   Document Released: 05/18/2005 Document Revised: 04/29/2015 Document Reviewed: 09/16/2011 Elsevier Interactive Patient Education [employer]2016 [employer]

## 2015-09-08 ENCOUNTER — Ambulatory Visit (INDEPENDENT_AMBULATORY_CARE_PROVIDER_SITE_OTHER): Payer: BLUE CROSS/BLUE SHIELD | Admitting: Internal Medicine

## 2015-09-08 ENCOUNTER — Encounter: Payer: Self-pay | Admitting: Internal Medicine

## 2015-09-08 VITALS — BP 110/80 | HR 72 | Ht 65.0 in | Wt 153.6 lb

## 2015-09-08 DIAGNOSIS — N951 Menopausal and female climacteric states: Secondary | ICD-10-CM | POA: Diagnosis not present

## 2015-09-08 DIAGNOSIS — I10 Essential (primary) hypertension: Secondary | ICD-10-CM | POA: Diagnosis not present

## 2015-09-08 MED ORDER — LISINOPRIL 20 MG PO TABS: 20.0000 mg | ORAL_TABLET | Freq: Every day | ORAL | Status: DC

## 2015-09-08 NOTE — Progress Notes (Signed)
Date:  09/08/2015   Name:  Phyllis Murphy   DOB:  1965-09-12   MRN:  [id number]   Chief Complaint: Follow-up and Hypertension Hypertension This is a new problem. The current episode started more than 1 month ago. The problem has been gradually improving since onset. Pertinent negatives include no chest pain, headaches, palpitations or shortness of breath. There are no associated agents to hypertension. Risk factors for coronary artery disease include smoking/tobacco exposure. Past treatments include ACE inhibitors (dose increased last visit). The current treatment provides significant improvement. There are no compliance problems.    Hot flashes - Patient is now having hot flashes and sweats at night. She is wondering if she is starting to menopause. She is status post partial hysterectomy. She does smoke cigarettes so we discussed that hormone replacement therapy is not appropriate.  Review of Systems  Constitutional: Positive for diaphoresis. Negative for fever, chills and fatigue.  Respiratory: Negative for cough, chest tightness and shortness of breath.   Cardiovascular: Negative for chest pain, palpitations and leg swelling.  Gastrointestinal: Negative for abdominal pain, diarrhea and constipation.  Genitourinary: Negative for dysuria.  Neurological: Negative for dizziness and headaches.  Psychiatric/Behavioral: Negative for sleep disturbance and dysphoric mood (but occasional irritability).    Patient Active Problem List   Diagnosis Date Noted  . Mixed hyperlipidemia 07/13/2015  . Tobacco use disorder 07/13/2015  . Cervical spinal stenosis 04/04/2012  . Essential hypertension 04/04/2012  . Back ache 02/03/2012  . Hand numbness 02/03/2012  . Cervical pain 02/03/2012    Prior to Admission medications   Medication Sig Start Date End Date Taking? Authorizing Provider  cyclobenzaprine (FLEXERIL) 10 MG tablet TAKE 1 TABLET(S) TWICE A DAY BY ORAL ROUTE. 03/05/15  Yes Historical  Provider, MD  lisinopril (PRINIVIL,ZESTRIL) 20 MG tablet Take 1 tablet (20 mg total) by mouth daily. 07/29/15  Yes Reubin Milan, MD  oxyCODONE-acetaminophen (PERCOCET) 7.5-325 MG per tablet Take 1 tablet by mouth every 4 (four) hours as needed for severe pain.   Yes Historical Provider, MD  oxyCODONE-acetaminophen (PERCOCET/ROXICET) 5-325 MG per tablet Take by mouth every 4 (four) hours as needed for severe pain.   Yes Historical Provider, MD    Allergies  Allergen Reactions  . Esmolol Anaphylaxis  . Latex Anaphylaxis  . Bacitracin Hives and Itching  . Sulfa Antibiotics Hives and Itching    Past Surgical History  Procedure Laterality Date  . Cervical fusion    . Abdominal hysterectomy    . Breast enhancement surgery      Social History  Substance Use Topics  . Smoking status: Current Every Day Smoker -- 1.00 packs/day for 30 years    Types: Cigarettes  . Smokeless tobacco: None  . Alcohol Use: No    Medication list has been reviewed and updated.   Physical Exam  Constitutional: She is oriented to person, place, and time. She appears well-nourished. No distress.  HENT:  Head: Normocephalic and atraumatic.  Neck: Normal range of motion. Neck supple. No thyromegaly present.  Cardiovascular: Normal rate, regular rhythm and normal heart sounds.   Pulmonary/Chest: Effort normal and breath sounds normal. No respiratory distress.  Musculoskeletal: Normal range of motion. She exhibits no edema.  Neurological: She is alert and oriented to person, place, and time.  Skin: Skin is warm and dry. No rash noted.  Psychiatric: She has a normal mood and affect. Her behavior is normal. Thought content normal.    BP 116/72 mmHg  Pulse 72  Ht  (1.651 m)  Wt 153 lb 9.6 oz (69.673 kg)  BMI 25.56 kg/m2  Assessment and Plan: 1. Essential hypertension Now controlled on higher dose - will continue - lisinopril (PRINIVIL,ZESTRIL) 20 MG tablet; Take 1 tablet (20 mg total) by mouth  daily.  Dispense: 90 tablet; Refill: 3  2. Menopausal syndrome Discussed natural progression HRT contraindicated   Bari Edward, MD [facility]  09/08/2015

## 2015-09-25 ENCOUNTER — Other Ambulatory Visit: Payer: Self-pay | Admitting: Internal Medicine

## 2015-12-16 DIAGNOSIS — M542 Cervicalgia: Secondary | ICD-10-CM | POA: Diagnosis not present

## 2015-12-16 DIAGNOSIS — Z79891 Long term (current) use of opiate analgesic: Secondary | ICD-10-CM | POA: Diagnosis not present

## 2016-01-21 ENCOUNTER — Ambulatory Visit
Admission: RE | Admit: 2016-01-21 | Discharge: 2016-01-21 | Disposition: A | Discharge status: Home / Self Care | Payer: BLUE CROSS/BLUE SHIELD | Source: Ambulatory Visit | Attending: Internal Medicine | Admitting: Internal Medicine

## 2016-01-21 ENCOUNTER — Ambulatory Visit (INDEPENDENT_AMBULATORY_CARE_PROVIDER_SITE_OTHER): Payer: BLUE CROSS/BLUE SHIELD | Admitting: Internal Medicine

## 2016-01-21 ENCOUNTER — Telehealth: Payer: Self-pay

## 2016-01-21 ENCOUNTER — Encounter: Payer: Self-pay | Admitting: Internal Medicine

## 2016-01-21 VITALS — BP 120/84 | HR 102 | Resp 16 | Ht 65.0 in | Wt 151.0 lb

## 2016-01-21 DIAGNOSIS — S99921A Unspecified injury of right foot, initial encounter: Secondary | ICD-10-CM | POA: Diagnosis not present

## 2016-01-21 DIAGNOSIS — M79671 Pain in right foot: Secondary | ICD-10-CM | POA: Diagnosis not present

## 2016-01-21 MED ORDER — COLCHICINE 0.6 MG PO TABS: 0.6000 mg | ORAL_TABLET | Freq: Two times a day (BID) | ORAL | Status: DC

## 2016-01-21 NOTE — Patient Instructions (Signed)

## 2016-01-21 NOTE — Progress Notes (Signed)
    Date:  01/21/2016   Name:  Phyllis Murphy   DOB:  Sep 07, 1965   MRN:  [id number]   Chief Complaint: Foot Swelling Symptoms started several days ago.  Initially may have twisted her ankle and foot.  Yesterday it became swollen and very tender, making it hard to walk.  She has percocet from pain management which she has continued.  She has no history of gout.   Review of Systems  Constitutional: Negative for fever and chills.  Musculoskeletal: Positive for joint swelling, arthralgias and gait problem.    Patient Active Problem List   Diagnosis Date Noted  . Mixed hyperlipidemia 07/13/2015  . Tobacco use disorder 07/13/2015  . Cervical spinal stenosis 04/04/2012  . Essential hypertension 04/04/2012  . Back ache 02/03/2012  . Hand numbness 02/03/2012  . Cervical pain 02/03/2012    Prior to Admission medications   Medication Sig Start Date End Date Taking? Authorizing Provider  cyclobenzaprine (FLEXERIL) 10 MG tablet TAKE 1 TABLET(S) TWICE A DAY BY ORAL ROUTE. 03/05/15  Yes Historical Provider, MD  lisinopril (PRINIVIL,ZESTRIL) 20 MG tablet Take 1 tablet (20 mg total) by mouth daily. 09/08/15  Yes Reubin MilanLaura H Bookert Guzzi, MD  oxyCODONE-acetaminophen (PERCOCET) 7.5-325 MG tablet TAKE 1 TABLET BY MOUTH TWICE A DAY FOR TOTAL DOSE OF 7.5/5/7.5/5. TO FILL 01-15-16. 01/15/16  Yes Historical Provider, MD  oxyCODONE-acetaminophen (PERCOCET/ROXICET) 5-325 MG tablet Take by mouth every 4 (four) hours as needed for severe pain.   Yes Historical Provider, MD  colchicine 0.6 MG tablet Take 1 tablet (0.6 mg total) by mouth 2 (two) times daily. 01/21/16   Reubin MilanLaura H Azile Minardi, MD    Allergies  Allergen Reactions  . Esmolol Anaphylaxis  . Latex Anaphylaxis  . Bacitracin Hives and Itching  . Sulfa Antibiotics Hives and Itching    Past Surgical History  Procedure Laterality Date  . Cervical fusion    . Abdominal hysterectomy    . Breast enhancement surgery      Social History  Substance Use Topics  .  Smoking status: Current Every Day Smoker -- 1.00 packs/day for 30 years    Types: Cigarettes  . Smokeless tobacco: None  . Alcohol Use: No     Medication list has been reviewed and updated.   Physical Exam  Constitutional: She is oriented to person, place, and time. She appears well-developed. She appears distressed.  HENT:  Head: Normocephalic and atraumatic.  Pulmonary/Chest: Effort normal. No respiratory distress.  Musculoskeletal: Normal range of motion.       Right ankle: She exhibits swelling. Tenderness (exquisite tenderness over 2-4 distal metatarsals).       Feet:  Neurological: She is alert and oriented to person, place, and time.  Skin: Skin is warm and dry. No rash noted.  Psychiatric: She has a normal mood and affect. Her behavior is normal. Thought content normal.    BP 120/84 mmHg  Pulse 102  Resp 16  Ht 5\' 5"  (1.651 m)  Wt 151 lb (68.493 kg)  BMI 25.13 kg/m2  SpO2 100%  Assessment and Plan: 1. Foot pain, right Gout vs fracture - DG Foot Complete Right; Future - colchicine 0.6 MG tablet; Take 1 tablet (0.6 mg total) by mouth 2 (two) times daily.  Dispense: 30 tablet; Refill: 0   Bari EdwardLaura Desyre Calma, MD [facility]  01/21/2016

## 2016-01-21 NOTE — Telephone Encounter (Signed)
Advised to start Gout Rx while awaiting result from xray

## 2016-01-29 ENCOUNTER — Other Ambulatory Visit: Payer: Self-pay | Admitting: Internal Medicine

## 2016-01-29 ENCOUNTER — Telehealth: Payer: Self-pay

## 2016-01-29 DIAGNOSIS — M79671 Pain in right foot: Secondary | ICD-10-CM

## 2016-01-29 MED ORDER — METHYLPREDNISOLONE 4 MG PO TBPK: ORAL_TABLET | ORAL | Status: DC

## 2016-01-29 NOTE — Telephone Encounter (Signed)
Foot is still hurting. Patient will try to take steroid and Dr. Judithann GravesBerglund calling [facility] Prednisone. [facility]

## 2016-03-07 ENCOUNTER — Encounter: Payer: Self-pay | Admitting: Internal Medicine

## 2016-03-07 ENCOUNTER — Ambulatory Visit (INDEPENDENT_AMBULATORY_CARE_PROVIDER_SITE_OTHER): Payer: BLUE CROSS/BLUE SHIELD | Admitting: Internal Medicine

## 2016-03-07 VITALS — BP 138/82 | HR 94 | Resp 16 | Ht 64.0 in | Wt 144.5 lb

## 2016-03-07 DIAGNOSIS — Z1239 Encounter for other screening for malignant neoplasm of breast: Secondary | ICD-10-CM

## 2016-03-07 DIAGNOSIS — F17200 Nicotine dependence, unspecified, uncomplicated: Secondary | ICD-10-CM | POA: Diagnosis not present

## 2016-03-07 DIAGNOSIS — M4802 Spinal stenosis, cervical region: Secondary | ICD-10-CM

## 2016-03-07 DIAGNOSIS — Z0000 Encounter for general adult medical examination without abnormal findings: Secondary | ICD-10-CM

## 2016-03-07 DIAGNOSIS — I10 Essential (primary) hypertension: Secondary | ICD-10-CM

## 2016-03-07 DIAGNOSIS — E782 Mixed hyperlipidemia: Secondary | ICD-10-CM | POA: Diagnosis not present

## 2016-03-07 DIAGNOSIS — Z23 Encounter for immunization: Secondary | ICD-10-CM | POA: Diagnosis not present

## 2016-03-07 LAB — POCT URINALYSIS DIPSTICK
Bilirubin, UA: NEGATIVE
Blood, UA: NEGATIVE
Glucose, UA: NEGATIVE
KETONES UA: NEGATIVE
LEUKOCYTES UA: NEGATIVE
Nitrite, UA: NEGATIVE
PROTEIN UA: NEGATIVE
Spec Grav, UA: 1.025
pH, UA: 7

## 2016-03-07 NOTE — Progress Notes (Signed)
Date:  03/07/2016   Name:  Phyllis Murphy   DOB:  1966/01/28   MRN:  [id number]   Chief Complaint: Annual Exam Phyllis Murphy is a 50 y.o. female who presents today for her Complete Annual Exam. She feels fairly well. She reports exercising minimally. She reports she is sleeping well.   Hypertension This is a chronic problem. The current episode started more than 1 year ago. The problem is unchanged. The problem is controlled. Pertinent negatives include no chest pain, headaches, palpitations or shortness of breath. Past treatments include ACE inhibitors. The current treatment provides significant improvement.  Back Pain This is a chronic problem. Progression since onset: she in planning to apply for disability - only able to work about 15 hours per week now. Pertinent negatives include no abdominal pain, chest pain, dysuria, fever, headaches, numbness, pelvic pain or weakness.    Review of Systems  Constitutional: Negative for fever, chills and fatigue.  HENT: Negative for congestion, hearing loss, tinnitus, trouble swallowing and voice change.   Eyes: Negative for visual disturbance.  Respiratory: Negative for cough, chest tightness, shortness of breath and wheezing.   Cardiovascular: Negative for chest pain, palpitations and leg swelling.  Gastrointestinal: Positive for constipation. Negative for vomiting, abdominal pain, diarrhea and blood in stool.  Endocrine: Negative for polydipsia and polyuria.  Genitourinary: Negative for dysuria, frequency, vaginal bleeding, vaginal discharge, genital sores and pelvic pain.  Musculoskeletal: Positive for back pain. Negative for joint swelling, arthralgias and gait problem.  Skin: Negative for color change and rash.  Neurological: Negative for dizziness, tremors, syncope, weakness, light-headedness, numbness and headaches.  Hematological: Negative for adenopathy. Does not bruise/bleed easily.  Psychiatric/Behavioral: Negative for sleep  disturbance and dysphoric mood. The patient is not nervous/anxious.     Patient Active Problem List   Diagnosis Date Noted  . Mixed hyperlipidemia 07/13/2015  . Tobacco use disorder 07/13/2015  . Cervical spinal stenosis 04/04/2012  . Essential hypertension 04/04/2012  . Back ache 02/03/2012  . Hand numbness 02/03/2012    Prior to Admission medications   Medication Sig Start Date End Date Taking? Authorizing Provider  colchicine 0.6 MG tablet Take 1 tablet (0.6 mg total) by mouth 2 (two) times daily. 01/21/16   Reubin MilanLaura H [employer], MD  cyclobenzaprine (FLEXERIL) 10 MG tablet TAKE 1 TABLET(S) TWICE A DAY BY ORAL ROUTE. 03/05/15   Historical Provider, MD  lisinopril (PRINIVIL,ZESTRIL) 20 MG tablet Take 1 tablet (20 mg total) by mouth daily. 09/08/15   Reubin MilanLaura H Cameron Katayama, MD  methylPREDNISolone (MEDROL DOSEPAK) 4 MG TBPK tablet Take 6 pills on day 1 the 5 pills day 2 then 4 pills day 3 then 3 pills day 4 then 2 pills day 5 then one pill day 6 then stop 01/29/16   Reubin MilanLaura H Albaro Deviney, MD  oxyCODONE-acetaminophen (PERCOCET) 7.5-325 MG tablet TAKE 1 TABLET BY MOUTH TWICE A DAY FOR TOTAL DOSE OF 7.5/5/7.5/5. TO FILL 01-15-16. 01/15/16   Historical Provider, MD  oxyCODONE-acetaminophen (PERCOCET/ROXICET) 5-325 MG tablet Take by mouth every 4 (four) hours as needed for severe pain.    Historical Provider, MD    Allergies  Allergen Reactions  . Esmolol Anaphylaxis  . Latex Anaphylaxis  . Bacitracin Hives and Itching  . Sulfa Antibiotics Hives and Itching    Past Surgical History  Procedure Laterality Date  . Cervical fusion    . Abdominal hysterectomy    . Breast enhancement surgery      Social History  Substance Use Topics  .  Smoking status: Current Every Day Smoker -- 1.00 packs/day for 30 years    Types: Cigarettes  . Smokeless tobacco: None  . Alcohol Use: No    Medication list has been reviewed and updated.   Physical Exam  Constitutional: She is oriented to person, place, and time. She  appears well-developed and well-nourished. No distress.  HENT:  Head: Normocephalic and atraumatic.  Right Ear: Tympanic membrane and ear canal normal.  Left Ear: Tympanic membrane and ear canal normal.  Nose: Right sinus exhibits no maxillary sinus tenderness. Left sinus exhibits no maxillary sinus tenderness.  Mouth/Throat: Uvula is midline and oropharynx is clear and moist.  Eyes: Conjunctivae and EOM are normal. Right eye exhibits no discharge. Left eye exhibits no discharge. No scleral icterus.  Neck: Normal range of motion. Carotid bruit is not present. No erythema present. No thyromegaly present.  Cardiovascular: Normal rate, regular rhythm, normal heart sounds and normal pulses.   Pulmonary/Chest: Effort normal. No respiratory distress. She has no wheezes. Right breast exhibits no mass, no nipple discharge, no skin change and no tenderness. Left breast exhibits no mass, no nipple discharge, no skin change and no tenderness.  Bilateral breast implants - soft, mobile and non tender  Abdominal: Soft. Bowel sounds are normal. There is no hepatosplenomegaly. There is no tenderness. There is no CVA tenderness.  Genitourinary: There is no tenderness, lesion or injury on the right labia. There is no tenderness, lesion or injury on the left labia. Right adnexum displays no mass, no tenderness and no fullness. Left adnexum displays no mass, no tenderness and no fullness.  Musculoskeletal: Normal range of motion.  Lymphadenopathy:    She has no cervical adenopathy.    She has no axillary adenopathy.  Neurological: She is alert and oriented to person, place, and time. She has normal reflexes. No cranial nerve deficit or sensory deficit.  Skin: Skin is warm, dry and intact. No rash noted.  Psychiatric: She has a normal mood and affect. Her speech is normal and behavior is normal. Thought content normal.  Nursing note and vitals reviewed.   BP 138/82 mmHg  Pulse 94  Resp 16  Ht  (1.626 m)   Wt 144 lb 8 oz (65.545 kg)  BMI 24.79 kg/m2  SpO2 100%  Assessment and Plan: 1. Annual physical exam Continue self breast exams Peri-menopausal symptoms with normal pelvic HRT contraindicated - POCT urinalysis dipstick  2. Essential hypertension controlled - CBC with Differential/Platelet - Comprehensive metabolic panel - TSH  3. Breast cancer screening - MM DIGITAL SCREENING BILATERAL; Future  4. Mixed hyperlipidemia Continue low fat diet - Lipid panel  5. Tobacco use disorder Continues to smoke - unable to consider quitting at this time  6. Need for pneumococcal vaccination - Pneumococcal polysaccharide vaccine 23-valent greater than or equal to 2yo subcutaneous/IM  7. Cervical spinal stenosis Followed by pain management On chronic narcotics Contemplating SSD application  Bari Edward, MD [facility]  03/07/2016

## 2016-03-07 NOTE — Patient Instructions (Addendum)
Breast Self-Awareness Practicing breast self-awareness may pick up problems early, prevent significant medical complications, and possibly save your life. By practicing breast self-awareness, you can become familiar with how your breasts look and feel and if your breasts are changing. This allows you to notice changes early. It can also offer you some reassurance that your breast health is good. One way to learn what is normal for your breasts and whether your breasts are changing is to do a breast self-exam. If you find a lump or something that was not present in the past, it is best to contact your caregiver right away. Other findings that should be evaluated by your caregiver include nipple discharge, especially if it is bloody; skin changes or reddening; areas where the skin seems to be pulled in (retracted); or new lumps and bumps. Breast pain is seldom associated with cancer (malignancy), but should also be evaluated by a caregiver. HOW TO PERFORM A BREAST SELF-EXAM The best time to examine your breasts is 5-7 days after your menstrual period is over. During menstruation, the breasts are lumpier, and it may be more difficult to pick up changes. If you do not menstruate, have reached menopause, or had your uterus removed (hysterectomy), you should examine your breasts at regular intervals, such as monthly. If you are breastfeeding, examine your breasts after a feeding or after using a breast pump. Breast implants do not decrease the risk for lumps or tumors, so continue to perform breast self-exams as recommended. Talk to your caregiver about how to determine the difference between the implant and breast tissue. Also, talk about the amount of pressure you should use during the exam. Over time, you will become more familiar with the variations of your breasts and more comfortable with the exam. A breast self-exam requires you to remove all your clothes above the waist.  Look at your breasts and nipples.  Stand in front of a mirror in a room with good lighting. With your hands on your hips, push your hands firmly downward. Look for a difference in shape, contour, and size from one breast to the other (asymmetry). Asymmetry includes puckers, dips, or bumps. Also, look for skin changes, such as reddened or scaly areas on the breasts. Look for nipple changes, such as discharge, dimpling, repositioning, or redness.  Carefully feel your breasts. This is best done either in the shower or tub while using soapy water or when flat on your back. Place the arm (on the side of the breast you are examining) above your head. Use the pads (not the fingertips) of your three middle fingers on your opposite hand to feel your breasts. Start in the underarm area and use  inch (2 cm) overlapping circles to feel your breast. Use 3 different levels of pressure (light, medium, and firm pressure) at each circle before moving to the next circle. The light pressure is needed to feel the tissue closest to the skin. The medium pressure will help to feel breast tissue a little deeper, while the firm pressure is needed to feel the tissue close to the ribs. Continue the overlapping circles, moving downward over the breast until you feel your ribs below your breast. Then, move one finger-width towards the center of the body. Continue to use the  inch (2 cm) overlapping circles to feel your breast as you move slowly up toward the collar bone (clavicle) near the base of the neck. Continue the up and down exam using all 3 pressures until you reach the   middle of the chest. Do this with each breast, carefully feeling for lumps or changes.   Keep a written record with breast changes or normal findings for each breast. By writing this information down, you do not need to depend only on memory for size, tenderness, or location. Write down where you are in your menstrual cycle, if you are still menstruating. Breast tissue can have some lumps or thick  tissue. However, see your caregiver if you find anything that concerns you.  SEEK MEDICAL CARE IF:  You see a change in shape, contour, or size of your breasts or nipples.   You see skin changes, such as reddened or scaly areas on the breasts or nipples.   You have an unusual discharge from your nipples.   You feel a new lump or unusually thick areas.    This information is not intended to replace advice given to you by your health care provider. Make sure you discuss any questions you have with your health care provider.   Document Released: 08/08/2005 Document Revised: 07/25/2012 Document Reviewed: 11/23/2011 Elsevier Interactive Patient Education 2016 Elsevier Inc. Pneumococcal Polysaccharide Vaccine: What You Need to Know 1. Why get vaccinated? Vaccination can protect older adults (and some children and younger adults) from pneumococcal disease. Pneumococcal disease is caused by bacteria that can spread from person to person through close contact. It can cause ear infections, and it can also lead to more serious infections of the:   Lungs (pneumonia),  Blood (bacteremia), and  Covering of the brain and spinal cord (meningitis). Meningitis can cause deafness and brain damage, and it can be fatal. Anyone can get pneumococcal disease, but children under 2 years of age, people with certain medical conditions, adults over 65 years of age, and cigarette smokers are at the highest risk. About 18,000 older adults die each year from pneumococcal disease in the [place]. Treatment of pneumococcal infections with penicillin and other drugs used to be more effective. But some strains of the disease have become resistant to these drugs. This makes prevention of the disease, through vaccination, even more important. 2. Pneumococcal polysaccharide vaccine (PPSV23) Pneumococcal polysaccharide vaccine (PPSV23) protects against 23 types of pneumococcal bacteria. It will not prevent all  pneumococcal disease. PPSV23 is recommended for:  All adults 65 years of age and older,  Anyone 2 through 48 years of age with certain long-term health problems,  Anyone 2 through 48 years of age with a weakened immune system,  Adults 19 through 48 years of age who smoke cigarettes or have asthma. Most people need only one dose of PPSV. A second dose is recommended for certain high-risk groups. People 65 and older should get a dose even if they have gotten one or more doses of the vaccine before they turned 65. Your healthcare provider can give you more information about these recommendations. Most healthy adults develop protection within 2 to 3 weeks of getting the shot. 3. Some people should not get this vaccine  Anyone who has had a life-threatening allergic reaction to PPSV should not get another dose.  Anyone who has a severe allergy to any component of PPSV should not receive it. Tell your provider if you have any severe allergies.  Anyone who is moderately or severely ill when the shot is scheduled may be asked to wait until they recover before getting the vaccine. Someone with a mild illness can usually be vaccinated.  Children less than 2 years of age should not receive this vaccine.    There is no evidence that PPSV is harmful to either a pregnant woman or to her fetus. However, as a precaution, women who need the vaccine should be vaccinated before becoming pregnant, if possible. 4. Risks of a vaccine reaction With any medicine, including vaccines, there is a chance of side effects. These are usually mild and go away on their own, but serious reactions are also possible. About half of people who get PPSV have mild side effects, such as redness or pain where the shot is given, which go away within about two days. Less than 1 out of 100 people develop a fever, muscle aches, or more severe local reactions. Problems that could happen after any vaccine:  People sometimes faint after  a medical procedure, including vaccination. Sitting or lying down for about 15 minutes can help prevent fainting, and injuries caused by a fall. Tell your doctor if you feel dizzy, or have vision changes or ringing in the ears.  Some people get severe pain in the shoulder and have difficulty moving the arm where a shot was given. This happens very rarely.  Any medication can cause a severe allergic reaction. Such reactions from a vaccine are very rare, estimated at about 1 in a million doses, and would happen within a few minutes to a few hours after the vaccination. As with any medicine, there is a very remote chance of a vaccine causing a serious injury or death. The safety of vaccines is always being monitored. For more information, visit: www.cdc.gov/vaccinesafety/ 5. What if there is a serious reaction? What should I look for? Look for anything that concerns you, such as signs of a severe allergic reaction, very high fever, or unusual behavior.  Signs of a severe allergic reaction can include hives, swelling of the face and throat, difficulty breathing, a fast heartbeat, dizziness, and weakness. These would usually start a few minutes to a few hours after the vaccination. What should I do? If you think it is a severe allergic reaction or other emergency that can't wait, call 9-1-1 or get to the nearest hospital. Otherwise, call your doctor. Afterward, the reaction should be reported to the Vaccine Adverse Event Reporting System (VAERS). Your doctor might file this report, or you can do it yourself through the VAERS web site at www.vaers.hhs.gov, or by calling 1-800-822-7967.  VAERS does not give medical advice. 6. How can I learn more?  Ask your doctor. He or she can give you the vaccine package insert or suggest other sources of information.  Call your local or state health department.  Contact the Centers for Disease Control and Prevention (CDC):  Call 1-800-232-4636 (1-800-CDC-INFO)  or  Visit CDC's website at www.cdc.gov/vaccines CDC Pneumococcal Polysaccharide Vaccine VIS (12/13/13)   This information is not intended to replace advice given to you by your health care provider. Make sure you discuss any questions you have with your health care provider.   Document Released: 06/05/2006 Document Revised: 08/29/2014 Document Reviewed: 12/16/2013 Elsevier Interactive Patient Education 2016 Elsevier Inc.  

## 2016-03-08 LAB — COMPREHENSIVE METABOLIC PANEL
ALBUMIN: 4.7 g/dL (ref 3.5–5.5)
ALK PHOS: 70 IU/L (ref 39–117)
ALT: 10 IU/L (ref 0–32)
AST: 16 IU/L (ref 0–40)
Albumin/Globulin Ratio: 2 (ref 1.2–2.2)
BILIRUBIN TOTAL: 0.2 mg/dL (ref 0.0–1.2)
BUN / CREAT RATIO: 15 (ref 9–23)
BUN: 9 mg/dL (ref 6–24)
CHLORIDE: 98 mmol/L (ref 96–106)
CO2: 21 mmol/L (ref 18–29)
Calcium: 9.4 mg/dL (ref 8.7–10.2)
Creatinine, Ser: 0.59 mg/dL (ref 0.57–1.00)
GFR calc Af Amer: 126 mL/min/{1.73_m2} (ref >59)
GFR calc non Af Amer: 109 mL/min/{1.73_m2} (ref >59)
GLUCOSE: 81 mg/dL (ref 65–99)
Globulin, Total: 2.4 g/dL (ref 1.5–4.5)
Potassium: 4.9 mmol/L (ref 3.5–5.2)
SODIUM: 138 mmol/L (ref 134–144)
Total Protein: 7.1 g/dL (ref 6.0–8.5)

## 2016-03-08 LAB — CBC WITH DIFFERENTIAL/PLATELET
BASOS: 0 %
Basophils Absolute: 0 10*3/uL (ref 0.0–0.2)
EOS (ABSOLUTE): 0 10*3/uL (ref 0.0–0.4)
EOS: 0 %
HEMOGLOBIN: 13.1 g/dL (ref 11.1–15.9)
Hematocrit: 40.5 % (ref 34.0–46.6)
Immature Grans (Abs): 0 10*3/uL (ref 0.0–0.1)
Immature Granulocytes: 0 %
LYMPHS: 26 %
Lymphocytes Absolute: 2.1 10*3/uL (ref 0.7–3.1)
MCH: 30.7 pg (ref 26.6–33.0)
MCHC: 32.3 g/dL (ref 31.5–35.7)
MCV: 95 fL (ref 79–97)
MONOCYTES: 6 %
Monocytes Absolute: 0.5 10*3/uL (ref 0.1–0.9)
NEUTROS PCT: 68 %
Neutrophils Absolute: 5.3 10*3/uL (ref 1.4–7.0)
Platelets: 536 10*3/uL — ABNORMAL HIGH (ref 150–379)
RBC: 4.27 x10E6/uL (ref 3.77–5.28)
RDW: 13.7 % (ref 12.3–15.4)
WBC: 7.9 10*3/uL (ref 3.4–10.8)

## 2016-03-08 LAB — LIPID PANEL
CHOLESTEROL TOTAL: 215 mg/dL — AB (ref 100–199)
Chol/HDL Ratio: 3.8 ratio units (ref 0.0–4.4)
HDL: 56 mg/dL (ref >39)
LDL Calculated: 139 mg/dL — ABNORMAL HIGH (ref 0–99)
Triglycerides: 100 mg/dL (ref 0–149)
VLDL CHOLESTEROL CAL: 20 mg/dL (ref 5–40)

## 2016-03-08 LAB — TSH: TSH: 1.34 u[IU]/mL (ref 0.450–4.500)

## 2016-03-16 DIAGNOSIS — M542 Cervicalgia: Secondary | ICD-10-CM | POA: Diagnosis not present

## 2016-06-13 DIAGNOSIS — M542 Cervicalgia: Secondary | ICD-10-CM | POA: Diagnosis not present

## 2016-06-20 DIAGNOSIS — M545 Low back pain: Secondary | ICD-10-CM | POA: Diagnosis not present

## 2016-06-20 DIAGNOSIS — M542 Cervicalgia: Secondary | ICD-10-CM | POA: Diagnosis not present

## 2016-07-05 DIAGNOSIS — M542 Cervicalgia: Secondary | ICD-10-CM | POA: Diagnosis not present

## 2016-07-05 DIAGNOSIS — M545 Low back pain: Secondary | ICD-10-CM | POA: Diagnosis not present

## 2016-07-05 DIAGNOSIS — Z79891 Long term (current) use of opiate analgesic: Secondary | ICD-10-CM | POA: Diagnosis not present

## 2016-08-09 DIAGNOSIS — M5136 Other intervertebral disc degeneration, lumbar region: Secondary | ICD-10-CM | POA: Diagnosis not present

## 2016-08-23 ENCOUNTER — Other Ambulatory Visit: Payer: Self-pay | Admitting: Internal Medicine

## 2016-08-23 DIAGNOSIS — I10 Essential (primary) hypertension: Secondary | ICD-10-CM

## 2016-09-06 ENCOUNTER — Encounter: Payer: Self-pay | Admitting: Internal Medicine

## 2016-09-06 ENCOUNTER — Ambulatory Visit (INDEPENDENT_AMBULATORY_CARE_PROVIDER_SITE_OTHER): Payer: BLUE CROSS/BLUE SHIELD | Admitting: Internal Medicine

## 2016-09-06 VITALS — BP 136/86 | HR 97 | Temp 97.6°F | Ht 65.0 in | Wt 156.0 lb

## 2016-09-06 DIAGNOSIS — R300 Dysuria: Secondary | ICD-10-CM

## 2016-09-06 DIAGNOSIS — I10 Essential (primary) hypertension: Secondary | ICD-10-CM

## 2016-09-06 DIAGNOSIS — M4802 Spinal stenosis, cervical region: Secondary | ICD-10-CM | POA: Diagnosis not present

## 2016-09-06 DIAGNOSIS — F17201 Nicotine dependence, unspecified, in remission: Secondary | ICD-10-CM

## 2016-09-06 LAB — POC URINALYSIS WITH MICROSCOPIC (NON AUTO)MANUAL RESULT
BILIRUBIN UA: NEGATIVE
CRYSTALS: 0
EPITHELIAL CELLS, URINE PER MICROSCOPY: 0
GLUCOSE UA: NEGATIVE
Ketones, UA: NEGATIVE
Leukocytes, UA: NEGATIVE
Mucus, UA: 0
Nitrite, UA: NEGATIVE
Protein, UA: NEGATIVE
RBC: 2 M/uL — AB (ref 4.04–5.48)
Spec Grav, UA: <=1.005
UROBILINOGEN UA: 0.2
WBC Casts, UA: 2
pH, UA: 5

## 2016-09-06 MED ORDER — CIPROFLOXACIN HCL 250 MG PO TABS: 250.0000 mg | ORAL_TABLET | Freq: Two times a day (BID) | ORAL | 0 refills | Status: DC

## 2016-09-06 NOTE — Progress Notes (Signed)
Date:  09/06/2016   Name:  Phyllis Murphy   DOB:  04-16-1966   MRN:  [id number]   Chief Complaint: Hypertension and Urinary Tract Infection Hypertension  This is a chronic problem. The current episode started more than 1 year ago. The problem is unchanged. The problem is controlled. Pertinent negatives include no chest pain, headaches, palpitations or shortness of breath. Past treatments include ACE inhibitors. The current treatment provides significant improvement.  Dysuria   This is a new problem. The quality of the pain is described as burning. There has been no fever. Associated symptoms include frequency. Pertinent negatives include no chills. She has tried nothing for the symptoms.  Chronic neck pain - she wants to have another opinion than [facility] for her neck.  Recent MRI was done - some abnormality that pain clinic wanted me to see.  She will bring a copy of that report.  I recommend that she [facility]   Review of Systems  Constitutional: Negative for chills and fatigue.  Eyes: Negative for visual disturbance.  Respiratory: Negative for cough, chest tightness and shortness of breath.   Cardiovascular: Negative for chest pain, palpitations and leg swelling.  Genitourinary: Positive for dysuria and frequency.  Neurological: Positive for dizziness (mild, intermittent). Negative for headaches.    Patient Active Problem List   Diagnosis Date Noted  . Mixed hyperlipidemia 07/13/2015  . Tobacco use disorder 07/13/2015  . Cervical spinal stenosis 04/04/2012  . Essential hypertension 04/04/2012  . Back ache 02/03/2012  . Hand numbness 02/03/2012    Prior to Admission medications   Medication Sig Start Date End Date Taking? Authorizing Provider  cyclobenzaprine (FLEXERIL) 10 MG tablet TAKE 1 TABLET(S) TWICE A DAY BY ORAL ROUTE. 03/05/15  Yes Historical Provider, MD  lisinopril (PRINIVIL,ZESTRIL) 20 MG tablet TAKE 1 TABLET  BY MOUTH  DAILY 08/24/16  Yes Reubin MilanLaura H Bret Vanessen, MD  oxyCODONE-acetaminophen (PERCOCET) 7.5-325 MG tablet TAKE 1 TABLET BY MOUTH TWICE A DAY FOR TOTAL DOSE OF 7.5/5/7.5/5. TO FILL 01-15-16. 01/15/16  Yes Historical Provider, MD  oxyCODONE-acetaminophen (PERCOCET/ROXICET) 5-325 MG tablet Take by mouth every 4 (four) hours as needed for severe pain.   Yes Historical Provider, MD  colchicine 0.6 MG tablet Take 1 tablet (0.6 mg total) by mouth 2 (two) times daily. Patient not taking: Reported on 09/06/2016 01/21/16   Reubin MilanLaura H Karam Dunson, MD    Allergies  Allergen Reactions  . Esmolol Anaphylaxis  . Latex Anaphylaxis  . Bacitracin Hives and Itching  . Other Hives    dermabond  . Sulfa Antibiotics Hives and Itching    Past Surgical History:  Procedure Laterality Date  . ABDOMINAL HYSTERECTOMY    . BREAST ENHANCEMENT SURGERY    . CERVICAL FUSION      Social History  Substance Use Topics  . Smoking status: Former Smoker    Packs/day: 1.00    Years: 30.00    Types: Cigarettes    Quit date: 06/08/2016  . Smokeless tobacco: Former NeurosurgeonUser  . Alcohol use No     Medication list has been reviewed and updated.   Physical Exam  Constitutional: She is oriented to person, place, and time. She appears well-developed. No distress.  HENT:  Head: Normocephalic and atraumatic.  Neck: Normal range of motion.  Cardiovascular: Normal rate, regular rhythm and normal heart sounds.   Pulmonary/Chest: Effort normal and breath sounds normal. No respiratory distress.  Musculoskeletal: Normal range of motion.  Neurological: She is alert and oriented to person, place, and time.  Skin: Skin is warm and dry. No rash noted.  Psychiatric: She has a normal mood and affect. Her behavior is normal. Thought content normal.  Nursing note and vitals reviewed.   BP 136/86   Pulse 97   Temp 97.6 F (36.4 C)   Ht 5\' 5"  (1.651 m)   Wt 156 lb (70.8 kg)   SpO2 98%   BMI 25.96 kg/m   Assessment and Plan: 1. Essential  hypertension controlled  2. Dysuria - POC urinalysis w microscopic (non auto) - ciprofloxacin (CIPRO) 250 MG tablet; Take 1 tablet (250 mg total) by mouth 2 (two) times daily.  Dispense: 6 tablet; Refill: 0  3. Cervical spinal stenosis Consult neurosurgery  4. Tobacco use disorder, severe, in early remission Congratulated on quitting   Bari Edward, MD [facility]  09/06/2016

## 2016-11-07 DIAGNOSIS — M5136 Other intervertebral disc degeneration, lumbar region: Secondary | ICD-10-CM | POA: Diagnosis not present

## 2016-11-07 DIAGNOSIS — M545 Low back pain: Secondary | ICD-10-CM | POA: Diagnosis not present

## 2016-11-07 DIAGNOSIS — Z79891 Long term (current) use of opiate analgesic: Secondary | ICD-10-CM | POA: Diagnosis not present

## 2016-12-19 ENCOUNTER — Telehealth: Payer: Self-pay

## 2016-12-19 NOTE — Telephone Encounter (Signed)
Pt called stating she is getting UTI from sexual intercourse. Wants antibiotic sent in without being seen. After speaking with Dr. Judithann Graves, called pt back and informed she will need to be seen in order to receive antibiotic. Pt states she don't have money to come in for co-pay. Stated she will "get some from somewhere if I have to" to get antibiotics herself.

## 2017-01-21 DIAGNOSIS — N390 Urinary tract infection, site not specified: Secondary | ICD-10-CM | POA: Diagnosis not present

## 2017-01-21 DIAGNOSIS — Z87440 Personal history of urinary (tract) infections: Secondary | ICD-10-CM | POA: Diagnosis not present

## 2017-02-07 DIAGNOSIS — M5136 Other intervertebral disc degeneration, lumbar region: Secondary | ICD-10-CM | POA: Diagnosis not present

## 2017-03-14 DIAGNOSIS — M5136 Other intervertebral disc degeneration, lumbar region: Secondary | ICD-10-CM | POA: Diagnosis not present

## 2017-03-14 DIAGNOSIS — Z79891 Long term (current) use of opiate analgesic: Secondary | ICD-10-CM | POA: Diagnosis not present

## 2017-03-14 DIAGNOSIS — G894 Chronic pain syndrome: Secondary | ICD-10-CM | POA: Diagnosis not present

## 2017-03-16 DIAGNOSIS — M5136 Other intervertebral disc degeneration, lumbar region: Secondary | ICD-10-CM | POA: Diagnosis not present

## 2017-03-22 DIAGNOSIS — N390 Urinary tract infection, site not specified: Secondary | ICD-10-CM | POA: Diagnosis not present

## 2017-03-22 DIAGNOSIS — Z87440 Personal history of urinary (tract) infections: Secondary | ICD-10-CM | POA: Diagnosis not present

## 2017-06-13 DIAGNOSIS — M25522 Pain in left elbow: Secondary | ICD-10-CM | POA: Diagnosis not present

## 2017-06-14 ENCOUNTER — Ambulatory Visit (INDEPENDENT_AMBULATORY_CARE_PROVIDER_SITE_OTHER): Payer: BLUE CROSS/BLUE SHIELD | Admitting: Internal Medicine

## 2017-06-14 ENCOUNTER — Encounter: Payer: Self-pay | Admitting: Internal Medicine

## 2017-06-14 VITALS — BP 122/80 | HR 60 | Ht 65.0 in | Wt 150.0 lb

## 2017-06-14 DIAGNOSIS — Z1239 Encounter for other screening for malignant neoplasm of breast: Secondary | ICD-10-CM

## 2017-06-14 DIAGNOSIS — I10 Essential (primary) hypertension: Secondary | ICD-10-CM

## 2017-06-14 DIAGNOSIS — Z0000 Encounter for general adult medical examination without abnormal findings: Secondary | ICD-10-CM | POA: Diagnosis not present

## 2017-06-14 DIAGNOSIS — Z720 Tobacco use: Secondary | ICD-10-CM

## 2017-06-14 DIAGNOSIS — M1000 Idiopathic gout, unspecified site: Secondary | ICD-10-CM

## 2017-06-14 DIAGNOSIS — E782 Mixed hyperlipidemia: Secondary | ICD-10-CM

## 2017-06-14 LAB — POCT URINALYSIS DIPSTICK
BILIRUBIN UA: NEGATIVE
Glucose, UA: NEGATIVE
Ketones, UA: NEGATIVE
LEUKOCYTES UA: NEGATIVE
NITRITE UA: NEGATIVE
PH UA: 6 (ref 5.0–8.0)
PROTEIN UA: NEGATIVE
RBC UA: NEGATIVE
Spec Grav, UA: <=1.005 — AB (ref 1.010–1.025)
Urobilinogen, UA: 0.2 E.U./dL

## 2017-06-14 MED ORDER — LISINOPRIL 20 MG PO TABS: 20.0000 mg | ORAL_TABLET | Freq: Every day | ORAL | 3 refills | Status: DC

## 2017-06-14 NOTE — Progress Notes (Signed)
Date:  06/14/2017   Name:  Phyllis Murphy   DOB:  1966/04/30   MRN:  [id number]   Chief Complaint: Annual Exam (Breast Exam. ) Phyllis ChromanDavona L Linse is a 51 y.o. female who presents today for her Complete Annual Exam. She feels fairly well. She reports exercising very little due to back pain. She reports she is sleeping fairly well. No breast problems.  Due for a mammogram.  Hypertension  This is a chronic problem. The problem is controlled. Pertinent negatives include no chest pain, headaches, palpitations or shortness of breath. Past treatments include ACE inhibitors.   Unfortunately she has started smoking again.  She is also having hot flashes and night sweats.  Some days much worse than others.  She has not had any further gout episodes in her foot but has colchicine on hand if needed.   Review of Systems  Constitutional: Negative for chills, fatigue and fever.  HENT: Negative for congestion, hearing loss, tinnitus, trouble swallowing and voice change.   Eyes: Negative for visual disturbance.  Respiratory: Negative for cough, chest tightness, shortness of breath and wheezing.   Cardiovascular: Negative for chest pain, palpitations and leg swelling.  Gastrointestinal: Negative for abdominal pain, constipation, diarrhea and vomiting.  Endocrine: Negative for polydipsia and polyuria.  Genitourinary: Positive for menstrual problem (hot flashes and sweats). Negative for dysuria, frequency, genital sores, vaginal bleeding and vaginal discharge.  Musculoskeletal: Positive for back pain. Negative for arthralgias, gait problem and joint swelling.  Skin: Negative for color change, rash and wound.  Neurological: Negative for dizziness, tremors, light-headedness and headaches.  Hematological: Negative for adenopathy. Does not bruise/bleed easily.  Psychiatric/Behavioral: Positive for sleep disturbance (due to hot flashes). Negative for dysphoric mood. The patient is not nervous/anxious.      Patient Active Problem List   Diagnosis Date Noted  . Idiopathic gout 06/14/2017  . Mixed hyperlipidemia 07/13/2015  . Tobacco abuse 07/13/2015  . Cervical spinal stenosis 04/04/2012  . Essential hypertension 04/04/2012  . Back ache 02/03/2012  . Hand numbness 02/03/2012    Prior to Admission medications   Medication Sig Start Date End Date Taking? Authorizing Provider  colchicine 0.6 MG tablet Take 1 tablet (0.6 mg total) by mouth 2 (two) times daily. 01/21/16  Yes Reubin MilanBerglund, Lamarkus Nebel H, MD  cyclobenzaprine (FLEXERIL) 10 MG tablet TAKE 1 TABLET(S) TWICE A DAY BY ORAL ROUTE. 03/05/15  Yes [provider]  lisinopril (PRINIVIL,ZESTRIL) 20 MG tablet TAKE 1 TABLET BY MOUTH  DAILY 08/24/16  Yes Reubin MilanBerglund, Wren Pryce H, MD  oxyCODONE-acetaminophen (PERCOCET) 7.5-325 MG tablet TAKE 1 TABLET BY MOUTH TWICE A DAY FOR TOTAL DOSE OF 7.5/5/7.5/5. TO FILL 01-15-16. 01/15/16  Yes [provider]    Allergies  Allergen Reactions  . Esmolol Anaphylaxis  . Latex Anaphylaxis  . Bacitracin Hives and Itching  . Other Hives    dermabond  . Sulfa Antibiotics Hives and Itching    Past Surgical History:  Procedure Laterality Date  . ABDOMINAL HYSTERECTOMY    . BREAST ENHANCEMENT SURGERY    . CERVICAL FUSION      Social History  Substance Use Topics  . Smoking status: Current Every Day Smoker    Packs/day: 1.00    Years: 30.00    Types: Cigarettes    Last attempt to quit: 06/08/2016  . Smokeless tobacco: Former NeurosurgeonUser  . Alcohol use No     Medication list has been reviewed and updated.  PHQ 2/9 Scores 06/14/2017 01/21/2016  PHQ - 2  Score 0 0    Physical Exam  Constitutional: She is oriented to person, place, and time. She appears well-developed and well-nourished. No distress.  HENT:  Head: Normocephalic and atraumatic.  Right Ear: Tympanic membrane and ear canal normal.  Left Ear: Tympanic membrane and ear canal normal.  Nose: Right sinus exhibits no maxillary sinus  tenderness. Left sinus exhibits no maxillary sinus tenderness.  Mouth/Throat: Uvula is midline and oropharynx is clear and moist.  Eyes: Conjunctivae and EOM are normal. Right eye exhibits no discharge. Left eye exhibits no discharge. No scleral icterus.  Neck: Normal range of motion. Carotid bruit is not present. No erythema present. No thyromegaly present.  Cardiovascular: Normal rate, regular rhythm, normal heart sounds and normal pulses.   Pulmonary/Chest: Effort normal. No respiratory distress. She has no wheezes. Right breast exhibits no mass, no nipple discharge, no skin change and no tenderness. Left breast exhibits no mass, no nipple discharge, no skin change and no tenderness.  Breast implants with some encapsulation  Abdominal: Soft. Bowel sounds are normal. There is no hepatosplenomegaly. There is no tenderness. There is no CVA tenderness.  Musculoskeletal: Normal range of motion.  Lymphadenopathy:    She has no cervical adenopathy.    She has no axillary adenopathy.  Neurological: She is alert and oriented to person, place, and time. She has normal reflexes. No cranial nerve deficit or sensory deficit.  Skin: Skin is warm, dry and intact. No rash noted.  Psychiatric: She has a normal mood and affect. Her speech is normal and behavior is normal. Thought content normal.  Nursing note and vitals reviewed.   BP 122/80   Pulse 60   Ht 5\' 5"  (1.651 m)   Wt 150 lb (68 kg)   BMI 24.96 kg/m   Assessment and Plan: 1. Annual physical exam Normal exam Hot flashes worsening - can not use HRT while smoking - POCT urinalysis dipstick  2. Breast cancer screening - MM DIGITAL SCREENING BILATERAL; Future  3. Essential hypertension controlled - CBC with Differential/Platelet - Comprehensive metabolic panel - TSH - lisinopril (PRINIVIL,ZESTRIL) 20 MG tablet; Take 1 tablet (20 mg total) by mouth daily.  Dispense: 90 tablet; Refill: 3  4. Mixed hyperlipidemia Will advise if  medication is needed - Lipid panel  5. Tobacco abuse Encouraged to quit again - used e-cig in the past with good results  6. Idiopathic gout, unspecified chronicity, unspecified site Not recurrent   Meds ordered this encounter  Medications  . lisinopril (PRINIVIL,ZESTRIL) 20 MG tablet    Sig: Take 1 tablet (20 mg total) by mouth daily.    Dispense:  90 tablet    Refill:  3    Partially dictated using Animal nutritionist. Any errors are unintentional.  Bari Edward, MD [facility]  06/14/2017

## 2017-06-14 NOTE — Patient Instructions (Signed)

## 2017-06-15 DIAGNOSIS — I10 Essential (primary) hypertension: Secondary | ICD-10-CM | POA: Diagnosis not present

## 2017-06-15 DIAGNOSIS — E782 Mixed hyperlipidemia: Secondary | ICD-10-CM | POA: Diagnosis not present

## 2017-06-16 LAB — LIPID PANEL
CHOLESTEROL TOTAL: 196 mg/dL (ref 100–199)
Chol/HDL Ratio: 4.2 ratio (ref 0.0–4.4)
HDL: 47 mg/dL (ref >39)
LDL CALC: 118 mg/dL — AB (ref 0–99)
Triglycerides: 155 mg/dL — ABNORMAL HIGH (ref 0–149)
VLDL Cholesterol Cal: 31 mg/dL (ref 5–40)

## 2017-06-16 LAB — CBC WITH DIFFERENTIAL/PLATELET
BASOS ABS: 0 10*3/uL (ref 0.0–0.2)
Basos: 0 %
EOS (ABSOLUTE): 0 10*3/uL (ref 0.0–0.4)
Eos: 0 %
Hematocrit: 40.6 % (ref 34.0–46.6)
Hemoglobin: 12.9 g/dL (ref 11.1–15.9)
Immature Grans (Abs): 0 10*3/uL (ref 0.0–0.1)
Immature Granulocytes: 0 %
LYMPHS ABS: 1.5 10*3/uL (ref 0.7–3.1)
Lymphs: 16 %
MCH: 29.9 pg (ref 26.6–33.0)
MCHC: 31.8 g/dL (ref 31.5–35.7)
MCV: 94 fL (ref 79–97)
MONOS ABS: 0.5 10*3/uL (ref 0.1–0.9)
Monocytes: 6 %
NEUTROS ABS: 7.1 10*3/uL — AB (ref 1.4–7.0)
Neutrophils: 78 %
Platelets: 362 10*3/uL (ref 150–379)
RBC: 4.32 x10E6/uL (ref 3.77–5.28)
RDW: 13.1 % (ref 12.3–15.4)
WBC: 9.3 10*3/uL (ref 3.4–10.8)

## 2017-06-16 LAB — COMPREHENSIVE METABOLIC PANEL
ALK PHOS: 53 IU/L (ref 39–117)
ALT: 8 IU/L (ref 0–32)
AST: 10 IU/L (ref 0–40)
Albumin/Globulin Ratio: 1.9 (ref 1.2–2.2)
Albumin: 4.4 g/dL (ref 3.5–5.5)
BUN / CREAT RATIO: 8 — AB (ref 9–23)
BUN: 5 mg/dL — AB (ref 6–24)
CHLORIDE: 104 mmol/L (ref 96–106)
CO2: 23 mmol/L (ref 20–29)
CREATININE: 0.6 mg/dL (ref 0.57–1.00)
Calcium: 9.1 mg/dL (ref 8.7–10.2)
GFR calc Af Amer: 125 mL/min/{1.73_m2} (ref >59)
GFR calc non Af Amer: 108 mL/min/{1.73_m2} (ref >59)
GLUCOSE: 70 mg/dL (ref 65–99)
Globulin, Total: 2.3 g/dL (ref 1.5–4.5)
Potassium: 4.8 mmol/L (ref 3.5–5.2)
SODIUM: 142 mmol/L (ref 134–144)
Total Protein: 6.7 g/dL (ref 6.0–8.5)

## 2017-06-16 LAB — TSH: TSH: 1.51 u[IU]/mL (ref 0.450–4.500)

## 2017-06-30 DIAGNOSIS — M79632 Pain in left forearm: Secondary | ICD-10-CM | POA: Diagnosis not present

## 2017-06-30 DIAGNOSIS — M25522 Pain in left elbow: Secondary | ICD-10-CM | POA: Diagnosis not present

## 2017-07-17 DIAGNOSIS — M545 Low back pain: Secondary | ICD-10-CM | POA: Diagnosis not present

## 2017-07-17 DIAGNOSIS — M5136 Other intervertebral disc degeneration, lumbar region: Secondary | ICD-10-CM | POA: Diagnosis not present

## 2017-07-17 DIAGNOSIS — M2550 Pain in unspecified joint: Secondary | ICD-10-CM | POA: Diagnosis not present

## 2017-07-17 DIAGNOSIS — Z79891 Long term (current) use of opiate analgesic: Secondary | ICD-10-CM | POA: Diagnosis not present

## 2017-07-19 IMAGING — CR DG FOOT COMPLETE 3+V*R*
3 series · 3 of 3 positions shown · non-contrast
Comparison: Right ankle films of 01/28/2007

CLINICAL DATA: Injured foot 2 days ago with pain on the plantar
aspect

EXAM:
RIGHT FOOT COMPLETE - 3+ VIEW

[foot ap]
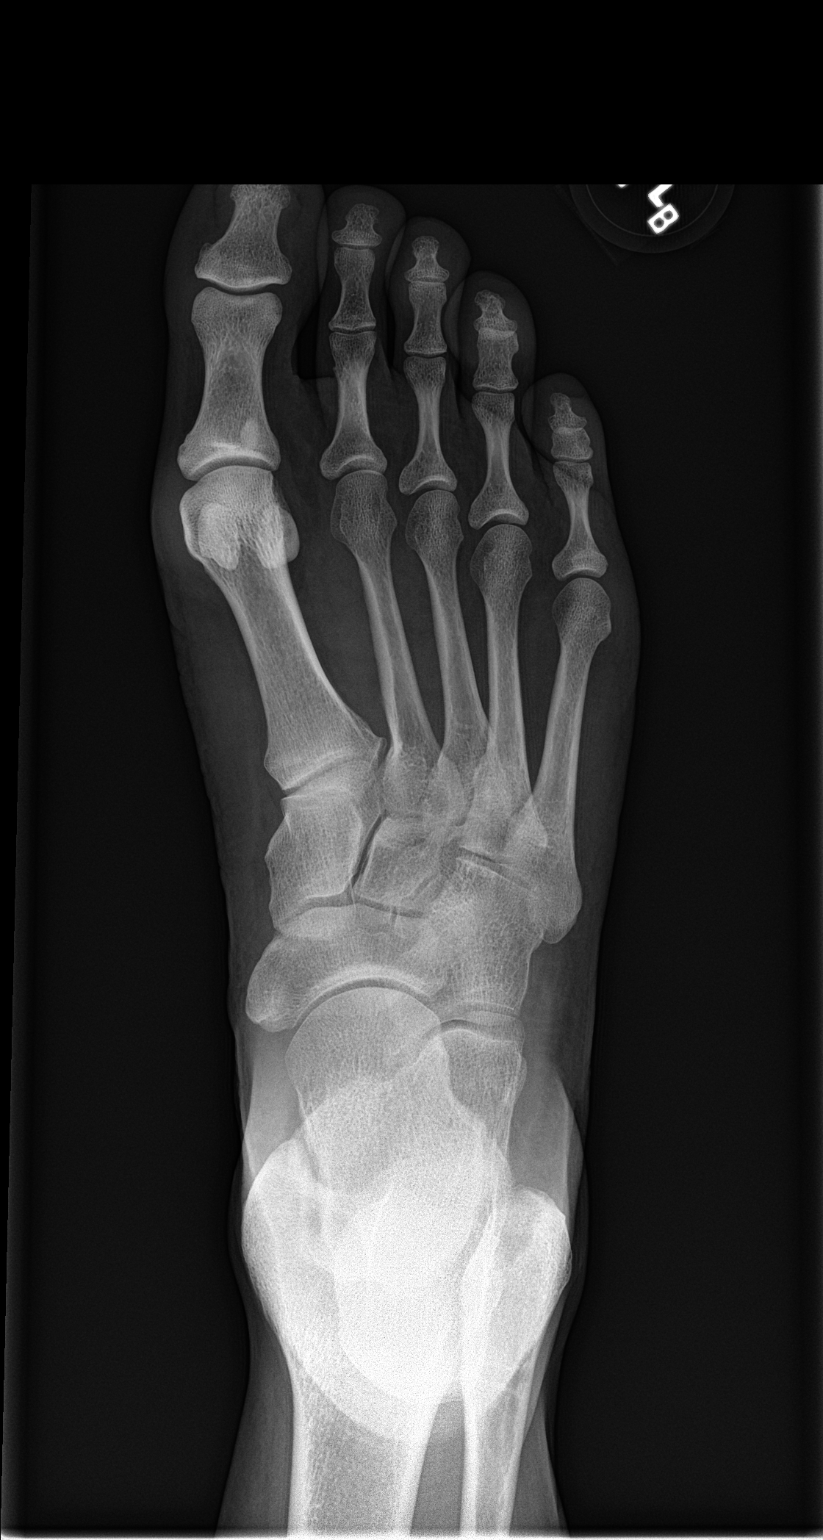

[foot obl]
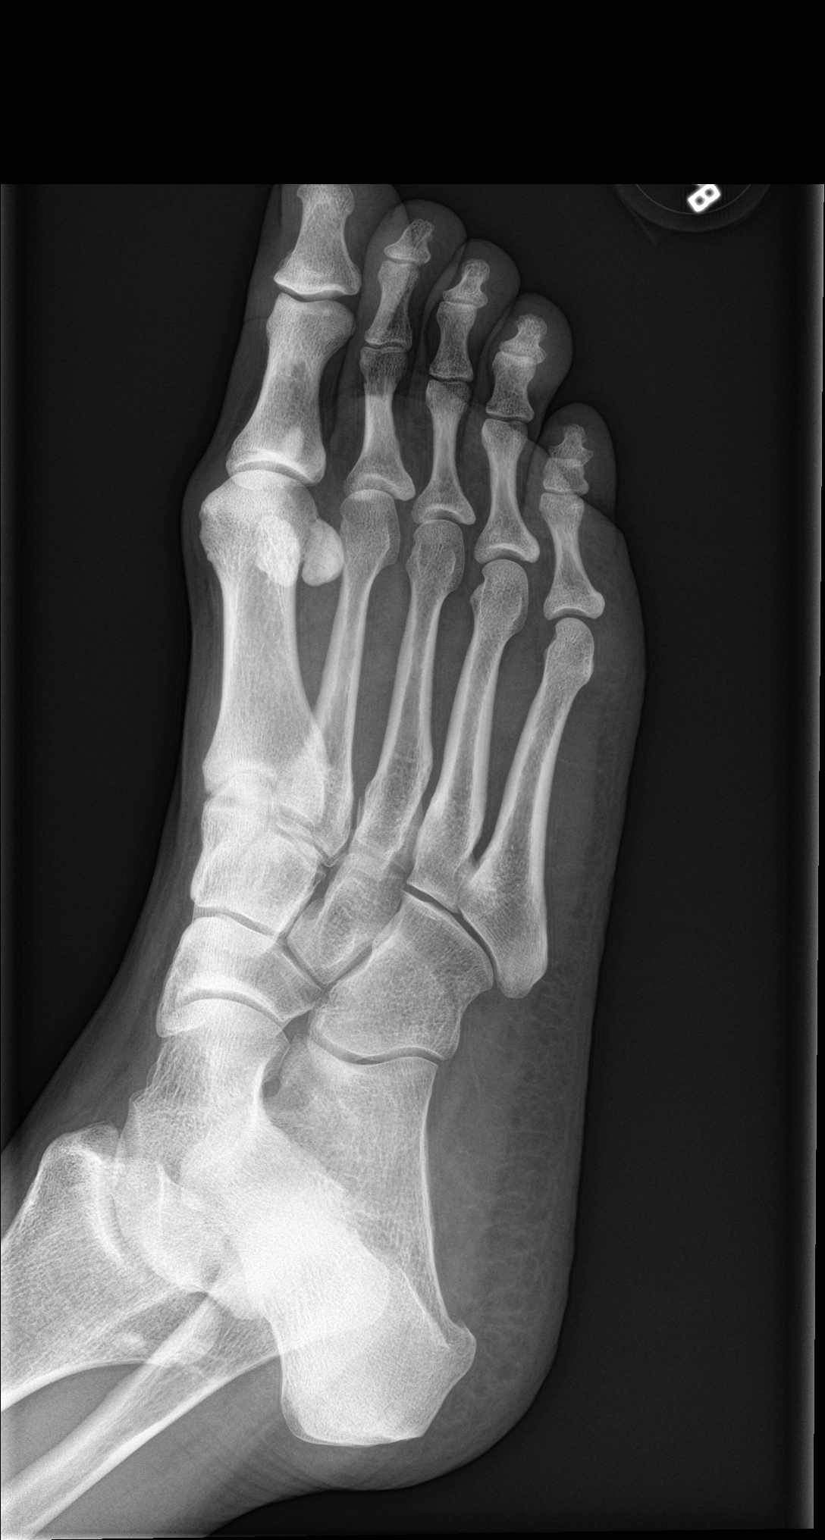

[foot lat]
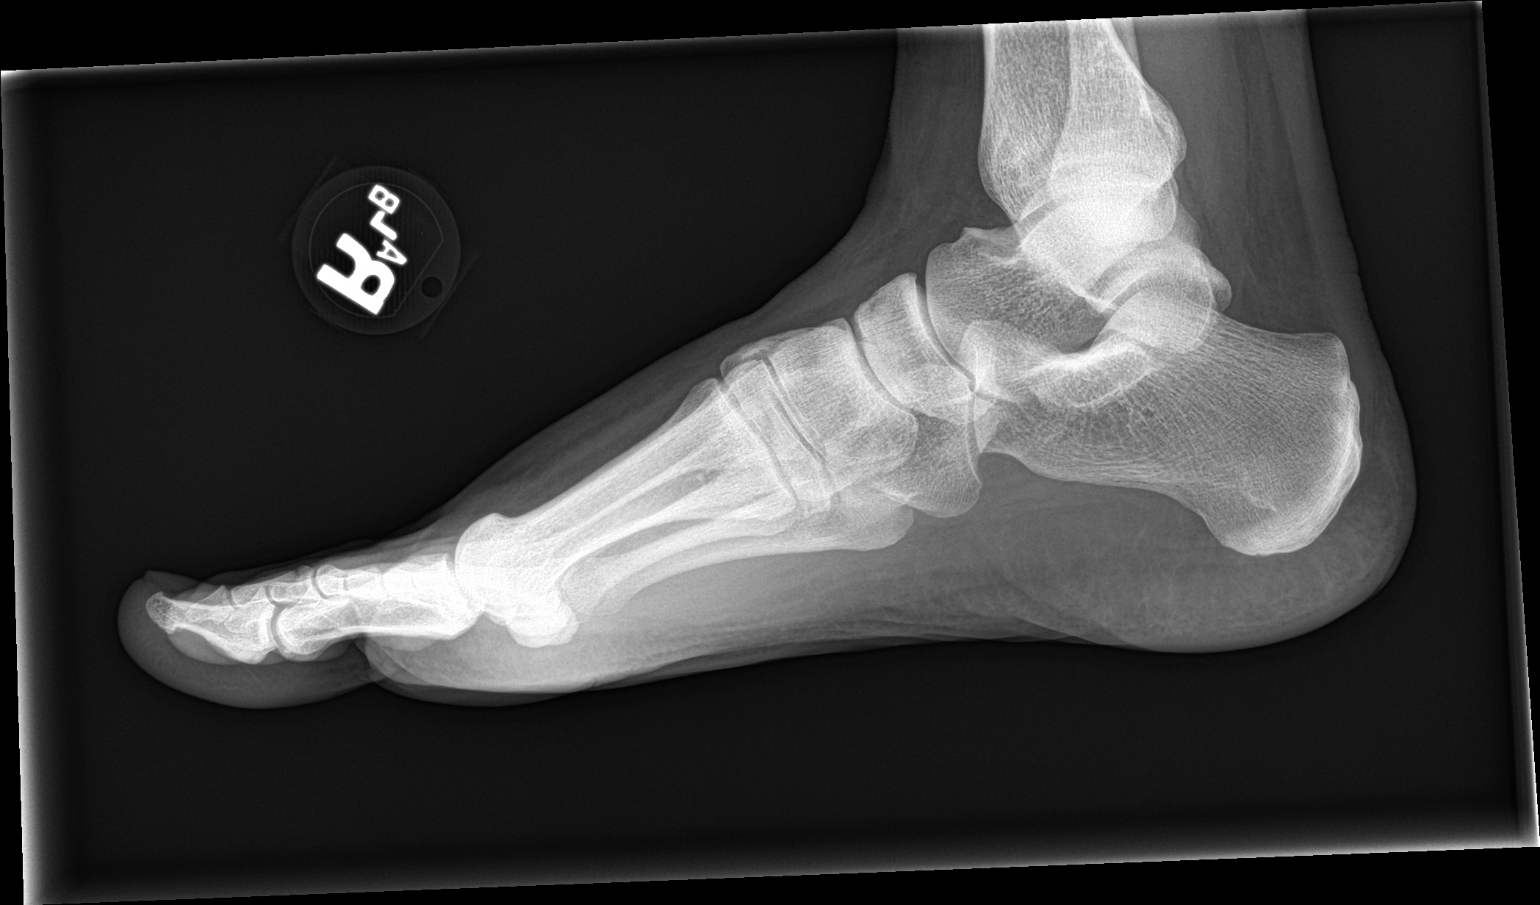

[3 of 3 positions shown; findings below may reference images not displayed]

FINDINGS: Tarsal-metatarsal alignment is normal. No fracture is seen. No
periosteal reaction is noted. The joint spaces appear normal. No
erosion is noted.
IMPRESSION: Negative.

## 2017-09-06 DIAGNOSIS — Z79891 Long term (current) use of opiate analgesic: Secondary | ICD-10-CM | POA: Diagnosis not present

## 2017-09-06 DIAGNOSIS — M5136 Other intervertebral disc degeneration, lumbar region: Secondary | ICD-10-CM | POA: Diagnosis not present

## 2017-09-06 DIAGNOSIS — M545 Low back pain: Secondary | ICD-10-CM | POA: Diagnosis not present

## 2017-09-06 DIAGNOSIS — M2550 Pain in unspecified joint: Secondary | ICD-10-CM | POA: Diagnosis not present

## 2017-09-19 DIAGNOSIS — H524 Presbyopia: Secondary | ICD-10-CM | POA: Diagnosis not present

## 2017-10-04 DIAGNOSIS — M5136 Other intervertebral disc degeneration, lumbar region: Secondary | ICD-10-CM | POA: Diagnosis not present

## 2017-11-29 DIAGNOSIS — M5136 Other intervertebral disc degeneration, lumbar region: Secondary | ICD-10-CM | POA: Diagnosis not present

## 2017-11-29 DIAGNOSIS — M545 Low back pain: Secondary | ICD-10-CM | POA: Diagnosis not present

## 2018-01-24 DIAGNOSIS — M545 Low back pain: Secondary | ICD-10-CM | POA: Diagnosis not present

## 2018-03-28 DIAGNOSIS — M5412 Radiculopathy, cervical region: Secondary | ICD-10-CM | POA: Diagnosis not present

## 2018-03-28 DIAGNOSIS — G894 Chronic pain syndrome: Secondary | ICD-10-CM | POA: Diagnosis not present

## 2018-03-28 DIAGNOSIS — Z79899 Other long term (current) drug therapy: Secondary | ICD-10-CM | POA: Diagnosis not present

## 2018-05-16 DIAGNOSIS — G894 Chronic pain syndrome: Secondary | ICD-10-CM | POA: Diagnosis not present

## 2018-06-05 ENCOUNTER — Other Ambulatory Visit: Payer: Self-pay | Admitting: Internal Medicine

## 2018-06-05 DIAGNOSIS — I10 Essential (primary) hypertension: Secondary | ICD-10-CM

## 2018-06-20 ENCOUNTER — Encounter: Payer: Self-pay | Admitting: Internal Medicine

## 2018-06-20 ENCOUNTER — Ambulatory Visit: Payer: BLUE CROSS/BLUE SHIELD | Admitting: Internal Medicine

## 2018-06-20 ENCOUNTER — Other Ambulatory Visit: Payer: Self-pay | Admitting: Internal Medicine

## 2018-06-20 VITALS — BP 128/80 | HR 100 | Ht 65.0 in | Wt 154.0 lb

## 2018-06-20 DIAGNOSIS — N3946 Mixed incontinence: Secondary | ICD-10-CM

## 2018-06-20 DIAGNOSIS — I10 Essential (primary) hypertension: Secondary | ICD-10-CM | POA: Diagnosis not present

## 2018-06-20 DIAGNOSIS — B360 Pityriasis versicolor: Secondary | ICD-10-CM | POA: Diagnosis not present

## 2018-06-20 DIAGNOSIS — Z1231 Encounter for screening mammogram for malignant neoplasm of breast: Secondary | ICD-10-CM

## 2018-06-20 DIAGNOSIS — E782 Mixed hyperlipidemia: Secondary | ICD-10-CM | POA: Diagnosis not present

## 2018-06-20 DIAGNOSIS — M67432 Ganglion, left wrist: Secondary | ICD-10-CM

## 2018-06-20 MED ORDER — KETOCONAZOLE 200 MG PO TABS: 200.0000 mg | ORAL_TABLET | Freq: Every day | ORAL | 0 refills | Status: DC

## 2018-06-20 MED ORDER — OXYBUTYNIN CHLORIDE ER 5 MG PO TB24: 5.0000 mg | ORAL_TABLET | Freq: Every day | ORAL | 5 refills | Status: DC

## 2018-06-20 MED ORDER — LISINOPRIL 20 MG PO TABS: 20.0000 mg | ORAL_TABLET | Freq: Every day | ORAL | 0 refills | Status: DC

## 2018-06-20 MED ORDER — LISINOPRIL 20 MG PO TABS: 20.0000 mg | ORAL_TABLET | Freq: Every day | ORAL | 1 refills | Status: DC

## 2018-06-20 NOTE — Progress Notes (Signed)
Date:  06/20/2018   Name:  Phyllis Murphy   DOB:  [birth year]   MRN:  [id number]   Chief Complaint: Hypertension (Liniopril refill.)  Hypertension  This is a chronic problem. The problem is controlled. Pertinent negatives include no chest pain, headaches, palpitations or shortness of breath. Past treatments include ACE inhibitors. The current treatment provides significant improvement.  Hyperlipidemia  This is a chronic problem. Recent lipid tests were reviewed and are variable. Pertinent negatives include no chest pain or shortness of breath. She is currently on no antihyperlipidemic treatment.  Rash  This is a recurrent problem. The affected locations include the back. Rash characteristics: hypopigmented. She was exposed to nothing. Pertinent negatives include no cough, fatigue, fever or shortness of breath.  Wrist Pain   The pain is present in the left wrist. This is a new problem. The problem occurs intermittently. Pertinent negatives include no fever or numbness. Treatments tried: massage.  Urinary Frequency   This is a new problem. The current episode started more than 1 month ago. The problem has been gradually worsening (stress incontinence of small amt but how also urge incontinence). Associated symptoms include frequency and urgency (and stress incontinence). Pertinent negatives include no hematuria.    Review of Systems  Constitutional: Negative for appetite change, fatigue, fever and unexpected weight change.  HENT: Negative for tinnitus and trouble swallowing.   Eyes: Negative for visual disturbance.  Respiratory: Negative for cough, chest tightness and shortness of breath.   Cardiovascular: Negative for chest pain, palpitations and leg swelling.  Gastrointestinal: Negative for abdominal pain.  Genitourinary: Positive for frequency and urgency (and stress incontinence). Negative for difficulty urinating, dysuria and hematuria.  Musculoskeletal: Positive for back pain.  Negative for arthralgias.  Skin: Positive for rash.  Neurological: Negative for tremors, numbness and headaches.  Psychiatric/Behavioral: Negative for dysphoric mood and sleep disturbance.    Patient Active Problem List   Diagnosis Date Noted  . Idiopathic gout 06/14/2017  . Mixed hyperlipidemia 07/13/2015  . Tobacco abuse 07/13/2015  . Cervical spinal stenosis 04/04/2012  . Essential hypertension 04/04/2012  . Back ache 02/03/2012  . Hand numbness 02/03/2012    Allergies  Allergen Reactions  . Esmolol Anaphylaxis  . Latex Anaphylaxis  . Bacitracin Hives and Itching  . Other Hives    dermabond  . Sulfa Antibiotics Hives and Itching    Past Surgical History:  Procedure Laterality Date  . ABDOMINAL HYSTERECTOMY    . BREAST ENHANCEMENT SURGERY    . CERVICAL FUSION      Social History   Tobacco Use  . Smoking status: Current Every Day Smoker    Packs/day: 1.00    Years: 30.00    Pack years: 30.00    Types: Cigarettes  . Smokeless tobacco: Former Engineer, water Use Topics  . Alcohol use: No    Alcohol/week: 0.0 standard drinks  . Drug use: No     Medication list has been reviewed and updated.  Current Meds  Medication Sig  . colchicine 0.6 MG tablet Take 1 tablet (0.6 mg total) by mouth 2 (two) times daily.  . cyclobenzaprine (FLEXERIL) 10 MG tablet Take 10 mg by mouth 4 (four) times daily.   Marland Kitchen lisinopril (PRINIVIL,ZESTRIL) 20 MG tablet TAKE 1 TABLET BY MOUTH  DAILY  . oxyCODONE-acetaminophen (PERCOCET) 7.5-325 MG tablet TAKE 1 TABLET BY MOUTH TWICE A DAY FOR TOTAL DOSE OF 7.5/5/7.5/5. TO FILL 01-15-16.  Marland Kitchen oxyCODONE-acetaminophen (PERCOCET/ROXICET) 5-325 MG tablet Take 1 tablet by mouth  2 (two) times daily.    PHQ 2/9 Scores 06/20/2018 06/14/2017 01/21/2016  PHQ - 2 Score 0 0 0    Physical Exam  Constitutional: She is oriented to person, place, and time. She appears well-developed. No distress.  HENT:  Head: Normocephalic and atraumatic.  Neck: Normal  range of motion. Neck supple.  Cardiovascular: Normal rate, regular rhythm and normal heart sounds.  Pulmonary/Chest: Effort normal and breath sounds normal. No respiratory distress.  Musculoskeletal: Normal range of motion.  Ganglion on dorsum of left wrist  Neurological: She is alert and oriented to person, place, and time.  Skin: Skin is warm and dry. No rash noted.     Psychiatric: She has a normal mood and affect. Her behavior is normal. Thought content normal.  Nursing note and vitals reviewed.   BP 128/80 (BP Location: Left Arm, Patient Position: Standing, Cuff Size: Normal)   Pulse 100   Ht 5\' 5"  (1.651 m)   Wt 154 lb (69.9 kg)   SpO2 98%   BMI 25.63 kg/m   Assessment and Plan: 1. Essential hypertension Controlled, continue current lisinopril dose - lisinopril (PRINIVIL,ZESTRIL) 20 MG tablet; Take 1 tablet (20 mg total) by mouth daily.  Dispense: 90 tablet; Refill: 1  2. Mixed hyperlipidemia Will check labs at [facility]  3. Tinea versicolor - ketoconazole (NIZORAL) 200 MG tablet; Take 1 tablet (200 mg total) by mouth daily.  Dispense: 3 tablet; Refill: 0  4. Encounter for screening mammogram for breast cancer Pt to schedule - not done in many years - MM 3D SCREEN BREAST BILATERAL; Future  5. Mixed incontinence urge and stress Trial of ditropan May need to see Uro-gyn - oxybutynin (DITROPAN-XL) 5 MG 24 hr tablet; Take 1 tablet (5 mg total) by mouth at bedtime.  Dispense: 30 tablet; Refill: 5  6. Ganglion cyst of dorsum of left wrist Recommend massage to reduce size and sx Ortho evaluation if needed  Partially dictated using Animal nutritionist. Any errors are unintentional.  Bari Edward, MD [facility]  06/20/2018

## 2018-06-27 ENCOUNTER — Encounter (INDEPENDENT_AMBULATORY_CARE_PROVIDER_SITE_OTHER): Payer: Self-pay

## 2018-06-27 ENCOUNTER — Ambulatory Visit
Admission: RE | Admit: 2018-06-27 | Discharge: 2018-06-27 | Disposition: A | Discharge status: Home / Self Care | Payer: BLUE CROSS/BLUE SHIELD | Source: Ambulatory Visit | Attending: Internal Medicine | Admitting: Internal Medicine

## 2018-06-27 DIAGNOSIS — Z1231 Encounter for screening mammogram for malignant neoplasm of breast: Secondary | ICD-10-CM | POA: Insufficient documentation

## 2018-08-08 DIAGNOSIS — M5412 Radiculopathy, cervical region: Secondary | ICD-10-CM | POA: Diagnosis not present

## 2018-08-08 DIAGNOSIS — G894 Chronic pain syndrome: Secondary | ICD-10-CM | POA: Diagnosis not present

## 2018-08-08 DIAGNOSIS — M5416 Radiculopathy, lumbar region: Secondary | ICD-10-CM | POA: Diagnosis not present

## 2018-08-21 DIAGNOSIS — L821 Other seborrheic keratosis: Secondary | ICD-10-CM | POA: Diagnosis not present

## 2018-09-18 ENCOUNTER — Other Ambulatory Visit: Payer: Self-pay

## 2018-09-18 DIAGNOSIS — N3946 Mixed incontinence: Secondary | ICD-10-CM

## 2018-09-18 MED ORDER — OXYBUTYNIN CHLORIDE ER 5 MG PO TB24: 5.0000 mg | ORAL_TABLET | Freq: Every day | ORAL | 0 refills | Status: DC

## 2018-10-01 ENCOUNTER — Other Ambulatory Visit: Payer: Self-pay | Admitting: Internal Medicine

## 2018-10-03 DIAGNOSIS — G894 Chronic pain syndrome: Secondary | ICD-10-CM | POA: Diagnosis not present

## 2018-10-03 DIAGNOSIS — M5412 Radiculopathy, cervical region: Secondary | ICD-10-CM | POA: Diagnosis not present

## 2018-10-03 DIAGNOSIS — M5416 Radiculopathy, lumbar region: Secondary | ICD-10-CM | POA: Diagnosis not present

## 2018-10-03 DIAGNOSIS — Z5181 Encounter for therapeutic drug level monitoring: Secondary | ICD-10-CM | POA: Diagnosis not present

## 2018-10-03 DIAGNOSIS — Z79899 Other long term (current) drug therapy: Secondary | ICD-10-CM | POA: Diagnosis not present

## 2018-10-17 DIAGNOSIS — M47817 Spondylosis without myelopathy or radiculopathy, lumbosacral region: Secondary | ICD-10-CM | POA: Diagnosis not present

## 2018-11-14 ENCOUNTER — Telehealth: Payer: Self-pay

## 2018-11-14 ENCOUNTER — Other Ambulatory Visit: Payer: Self-pay

## 2018-11-14 ENCOUNTER — Ambulatory Visit (INDEPENDENT_AMBULATORY_CARE_PROVIDER_SITE_OTHER): Payer: BLUE CROSS/BLUE SHIELD | Admitting: Internal Medicine

## 2018-11-14 ENCOUNTER — Encounter: Payer: Self-pay | Admitting: Internal Medicine

## 2018-11-14 VITALS — BP 128/80 | HR 102 | Temp 98.4°F | Ht 65.0 in | Wt 154.0 lb

## 2018-11-14 DIAGNOSIS — E782 Mixed hyperlipidemia: Secondary | ICD-10-CM | POA: Diagnosis not present

## 2018-11-14 DIAGNOSIS — N3946 Mixed incontinence: Secondary | ICD-10-CM

## 2018-11-14 DIAGNOSIS — Z1211 Encounter for screening for malignant neoplasm of colon: Secondary | ICD-10-CM | POA: Diagnosis not present

## 2018-11-14 DIAGNOSIS — K5903 Drug induced constipation: Secondary | ICD-10-CM | POA: Insufficient documentation

## 2018-11-14 DIAGNOSIS — Z720 Tobacco use: Secondary | ICD-10-CM | POA: Diagnosis not present

## 2018-11-14 DIAGNOSIS — Z1231 Encounter for screening mammogram for malignant neoplasm of breast: Secondary | ICD-10-CM | POA: Diagnosis not present

## 2018-11-14 DIAGNOSIS — Z0000 Encounter for general adult medical examination without abnormal findings: Secondary | ICD-10-CM | POA: Diagnosis not present

## 2018-11-14 DIAGNOSIS — J0100 Acute maxillary sinusitis, unspecified: Secondary | ICD-10-CM

## 2018-11-14 DIAGNOSIS — I10 Essential (primary) hypertension: Secondary | ICD-10-CM

## 2018-11-14 LAB — POCT URINALYSIS DIPSTICK
Bilirubin, UA: NEGATIVE
Glucose, UA: NEGATIVE
Ketones, UA: NEGATIVE
Leukocytes, UA: NEGATIVE
Nitrite, UA: NEGATIVE
PROTEIN UA: NEGATIVE
Spec Grav, UA: <=1.005 — AB (ref 1.010–1.025)
Urobilinogen, UA: 0.2 E.U./dL
pH, UA: 5 (ref 5.0–8.0)

## 2018-11-14 MED ORDER — NALOXEGOL OXALATE 25 MG PO TABS: 25.0000 mg | ORAL_TABLET | Freq: Every day | ORAL | 0 refills | Status: DC

## 2018-11-14 MED ORDER — AMOXICILLIN-POT CLAVULANATE 875-125 MG PO TABS: 1.0000 | ORAL_TABLET | Freq: Two times a day (BID) | ORAL | 0 refills | Status: AC

## 2018-11-14 MED ORDER — LISINOPRIL 20 MG PO TABS: 20.0000 mg | ORAL_TABLET | Freq: Every day | ORAL | 3 refills | Status: DC

## 2018-11-14 NOTE — Telephone Encounter (Signed)
error 

## 2018-11-14 NOTE — Telephone Encounter (Signed)
Completed PA on patients medication for MOVANTIC 25 mg daily. Awaiting results within 72 hours.   Completed on [email or web address].

## 2018-11-14 NOTE — Progress Notes (Signed)
Date:  11/14/2018   Name:  Loman ChromanDavona L Mcclanahan   DOB:  16-May-1966   MRN:  [id number]   Chief Complaint: Annual Exam and Sinus Problem (Husband had sinus infection last week. Started 2 weeks ago. Some pressure in face. Starting to move chest. Cough- she is a smoker. Yellow production. ) Kiani L Yates DecampMcCallum is a 53 y.o. female who presents today for her Complete Annual Exam. She feels fairly well. She reports exercising some. She reports she is sleeping fairly well.  She is no longer working due to disability from back pain.  Last Mammogram 06/2018 No previous colonoscopy No longer needs Pap smears due to hysterectomy. PPV-23 done in 2017  Hypertension  This is a chronic problem. The problem is controlled. Pertinent negatives include no chest pain, headaches, palpitations or shortness of breath. Past treatments include ACE inhibitors. The current treatment provides significant improvement.  Urinary Frequency   This is a chronic problem. The patient is experiencing no pain. Associated symptoms include frequency. Pertinent negatives include no chills or vomiting. Treatments tried: Ditropan. The treatment provided no relief.  Sinusitis  This is a new problem. The current episode started 1 to 4 weeks ago. The problem has been gradually worsening since onset. There has been no fever. The pain is mild. Associated symptoms include congestion, coughing and sinus pressure. Pertinent negatives include no chills, headaches or shortness of breath.  Constipation  This is a chronic problem. The problem is unchanged. Her stool frequency is 1 time per day. The stool is described as pellet like. The patient is on a high fiber diet. She does not exercise regularly. There has been adequate water intake. Pertinent negatives include no abdominal pain, diarrhea, fever or vomiting. Risk factors: oxycodone.    Review of Systems  Constitutional: Negative for chills, fatigue and fever.  HENT: Positive for congestion  and sinus pressure. Negative for hearing loss, postnasal drip, sinus pain, tinnitus, trouble swallowing and voice change.   Eyes: Negative for visual disturbance.  Respiratory: Positive for cough. Negative for chest tightness, shortness of breath and wheezing.   Cardiovascular: Negative for chest pain, palpitations and leg swelling.  Gastrointestinal: Positive for constipation. Negative for abdominal pain, diarrhea and vomiting.  Endocrine: Negative for polydipsia and polyuria.  Genitourinary: Positive for frequency. Negative for dysuria, genital sores, vaginal bleeding and vaginal discharge.  Musculoskeletal: Negative for arthralgias, gait problem and joint swelling.  Skin: Negative for color change and rash.  Allergic/Immunologic: Positive for environmental allergies.  Neurological: Negative for dizziness, tremors, light-headedness and headaches.  Hematological: Negative for adenopathy. Does not bruise/bleed easily.  Psychiatric/Behavioral: Negative for dysphoric mood and sleep disturbance. The patient is not nervous/anxious.     Patient Active Problem List   Diagnosis Date Noted  . Ganglion cyst of dorsum of left wrist 06/20/2018  . Mixed incontinence urge and stress 06/20/2018  . Tinea versicolor 06/20/2018  . Idiopathic gout 06/14/2017  . Mixed hyperlipidemia 07/13/2015  . Tobacco abuse 07/13/2015  . Cervical spinal stenosis 04/04/2012  . Essential hypertension 04/04/2012  . Back ache 02/03/2012  . Hand numbness 02/03/2012    Allergies  Allergen Reactions  . Esmolol Anaphylaxis  . Latex Anaphylaxis  . Bacitracin Hives and Itching  . Other Hives    dermabond  . Sulfa Antibiotics Hives and Itching    Past Surgical History:  Procedure Laterality Date  . ABDOMINAL HYSTERECTOMY    . AUGMENTATION MAMMAPLASTY Bilateral 2004  . BREAST ENHANCEMENT SURGERY    . CERVICAL FUSION  Social History   Tobacco Use  . Smoking status: Current Every Day Smoker    Packs/day:  1.00    Years: 30.00    Pack years: 30.00    Types: Cigarettes  . Smokeless tobacco: Never Used  Substance Use Topics  . Alcohol use: No    Alcohol/week: 0.0 standard drinks  . Drug use: No     Medication list has been reviewed and updated.  Current Meds  Medication Sig  . cyclobenzaprine (FLEXERIL) 10 MG tablet Take 10 mg by mouth 4 (four) times daily.   Marland Kitchen lisinopril (PRINIVIL,ZESTRIL) 20 MG tablet TAKE 1 TABLET BY MOUTH  DAILY  . oxyCODONE-acetaminophen (PERCOCET) 7.5-325 MG tablet TAKE 1 TABLET BY MOUTH TWICE A DAY FOR TOTAL DOSE OF 7.5/5/7.5/5. TO FILL 01-15-16.  Marland Kitchen oxyCODONE-acetaminophen (PERCOCET/ROXICET) 5-325 MG tablet Take 1 tablet by mouth 2 (two) times daily.    PHQ 2/9 Scores 11/14/2018 06/20/2018 06/14/2017 01/21/2016  PHQ - 2 Score 0 0 0 0    Physical Exam Vitals signs and nursing note reviewed.  Constitutional:      General: She is not in acute distress.    Appearance: She is well-developed.  HENT:     Head: Normocephalic and atraumatic.     Right Ear: Tympanic membrane and ear canal normal.     Left Ear: Tympanic membrane and ear canal normal.     Nose:     Right Sinus: No maxillary sinus tenderness.     Left Sinus: No maxillary sinus tenderness.     Mouth/Throat:     Pharynx: Uvula midline.  Eyes:     General: No scleral icterus.       Right eye: No discharge.        Left eye: No discharge.     Conjunctiva/sclera: Conjunctivae normal.  Neck:     Musculoskeletal: Normal range of motion. No erythema.     Thyroid: No thyromegaly.     Vascular: No carotid bruit.  Cardiovascular:     Rate and Rhythm: Normal rate and regular rhythm.  No extrasystoles are present.    Pulses: Normal pulses.     Heart sounds: Normal heart sounds. No murmur.  Pulmonary:     Effort: Pulmonary effort is normal. No respiratory distress.     Breath sounds: No wheezing.  Chest:     Breasts: Breasts are asymmetrical (due to implant shifts).        Right: No mass, nipple  discharge, skin change or tenderness.        Left: No mass, nipple discharge, skin change or tenderness.  Abdominal:     General: Abdomen is flat. Bowel sounds are normal.     Palpations: Abdomen is soft.     Tenderness: There is no abdominal tenderness. There is no guarding or rebound.  Musculoskeletal:     Right shoulder: She exhibits normal range of motion.     Lumbar back: She exhibits decreased range of motion and tenderness.     Right lower leg: No edema.     Left lower leg: No edema.  Lymphadenopathy:     Cervical: No cervical adenopathy.  Skin:    General: Skin is warm and dry.     Findings: No rash.  Neurological:     Mental Status: She is alert and oriented to person, place, and time.     Cranial Nerves: No cranial nerve deficit.     Sensory: No sensory deficit.     Deep Tendon Reflexes: Reflexes are  normal and symmetric.  Psychiatric:        Attention and Perception: Attention normal.        Mood and Affect: Mood normal.        Speech: Speech normal.        Behavior: Behavior normal.        Thought Content: Thought content normal.     Wt Readings from Last 3 Encounters:  11/14/18 154 lb (69.9 kg)  06/20/18 154 lb (69.9 kg)  06/14/17 150 lb (68 kg)    BP 128/80   Pulse (!) 102   Temp 98.4 F (36.9 C) (Oral)   Ht 5\' 5"  (1.651 m)   Wt 154 lb (69.9 kg)   SpO2 100%   BMI 25.63 kg/m   Assessment and Plan: 1. Annual physical exam Normal exam Continue healthy diet, exercise as able - POCT urinalysis dipstick  2. Encounter for screening mammogram for breast cancer Due in November  3. Essential hypertension controlled - CBC with Differential/Platelet - Comprehensive metabolic panel - TSH - lisinopril (PRINIVIL,ZESTRIL) 20 MG tablet; Take 1 tablet (20 mg total) by mouth daily.  Dispense: 90 tablet; Refill: 3  4. Mixed hyperlipidemia Check labs - Lipid panel  5. Mixed incontinence urge and stress No benefit from Ditropan Sample of Toviaz 8 mg given   6. Tobacco abuse Encouraged pt to cut back with a goal to quit completely  7. Colon cancer screening - Ambulatory referral to Gastroenterology  8. Acute non-recurrent maxillary sinusitis Continue Allegra - amoxicillin-clavulanate (AUGMENTIN) 875-125 MG tablet; Take 1 tablet by mouth 2 (two) times daily for 10 days.  Dispense: 20 tablet; Refill: 0  9. Drug-induced constipation Stop Senekot; trial of movantik - naloxegol oxalate (MOVANTIK) 25 MG TABS tablet; Take 1 tablet (25 mg total) by mouth daily.  Dispense: 30 tablet; Refill: 0   Partially dictated using Animal nutritionist. Any errors are unintentional.  Bari Edward, MD [facility]  11/14/2018

## 2018-11-15 ENCOUNTER — Other Ambulatory Visit: Payer: Self-pay | Admitting: Internal Medicine

## 2018-11-15 DIAGNOSIS — K5903 Drug induced constipation: Secondary | ICD-10-CM

## 2018-11-15 LAB — COMPREHENSIVE METABOLIC PANEL
A/G RATIO: 2 (ref 1.2–2.2)
ALBUMIN: 4.7 g/dL (ref 3.8–4.8)
ALK PHOS: 66 IU/L (ref 39–117)
ALT: 7 IU/L (ref 0–32)
AST: 13 IU/L (ref 0–40)
BUN / CREAT RATIO: 7 — AB (ref 9–23)
BUN: 5 mg/dL — ABNORMAL LOW (ref 6–24)
Bilirubin Total: <0.2 mg/dL (ref 0.0–1.2)
CO2: 21 mmol/L (ref 20–29)
Calcium: 10.3 mg/dL — ABNORMAL HIGH (ref 8.7–10.2)
Chloride: 103 mmol/L (ref 96–106)
Creatinine, Ser: 0.73 mg/dL (ref 0.57–1.00)
GFR calc Af Amer: 111 mL/min/{1.73_m2} (ref >59)
GFR, EST NON AFRICAN AMERICAN: 96 mL/min/{1.73_m2} (ref >59)
GLOBULIN, TOTAL: 2.4 g/dL (ref 1.5–4.5)
Glucose: 90 mg/dL (ref 65–99)
POTASSIUM: 4.9 mmol/L (ref 3.5–5.2)
SODIUM: 141 mmol/L (ref 134–144)
Total Protein: 7.1 g/dL (ref 6.0–8.5)

## 2018-11-15 LAB — CBC WITH DIFFERENTIAL/PLATELET
BASOS: 0 %
Basophils Absolute: 0 10*3/uL (ref 0.0–0.2)
EOS (ABSOLUTE): 0.1 10*3/uL (ref 0.0–0.4)
EOS: 1 %
HEMATOCRIT: 39.8 % (ref 34.0–46.6)
Hemoglobin: 13.5 g/dL (ref 11.1–15.9)
IMMATURE GRANULOCYTES: 0 %
Immature Grans (Abs): 0 10*3/uL (ref 0.0–0.1)
LYMPHS ABS: 1.7 10*3/uL (ref 0.7–3.1)
Lymphs: 20 %
MCH: 30.3 pg (ref 26.6–33.0)
MCHC: 33.9 g/dL (ref 31.5–35.7)
MCV: 89 fL (ref 79–97)
MONOS ABS: 0.4 10*3/uL (ref 0.1–0.9)
Monocytes: 5 %
Neutrophils Absolute: 6.3 10*3/uL (ref 1.4–7.0)
Neutrophils: 74 %
Platelets: 391 10*3/uL (ref 150–450)
RBC: 4.45 x10E6/uL (ref 3.77–5.28)
RDW: 12.5 % (ref 11.7–15.4)
WBC: 8.5 10*3/uL (ref 3.4–10.8)

## 2018-11-15 LAB — LIPID PANEL
CHOL/HDL RATIO: 4 ratio (ref 0.0–4.4)
Cholesterol, Total: 221 mg/dL — ABNORMAL HIGH (ref 100–199)
HDL: 55 mg/dL (ref >39)
LDL Calculated: 135 mg/dL — ABNORMAL HIGH (ref 0–99)
Triglycerides: 156 mg/dL — ABNORMAL HIGH (ref 0–149)
VLDL Cholesterol Cal: 31 mg/dL (ref 5–40)

## 2018-11-15 LAB — TSH: TSH: 1.72 u[IU]/mL (ref 0.450–4.500)

## 2018-11-15 MED ORDER — NALDEMEDINE TOSYLATE 0.2 MG PO TABS: 1.0000 | ORAL_TABLET | Freq: Every day | ORAL | 0 refills | Status: DC

## 2018-11-21 ENCOUNTER — Other Ambulatory Visit: Payer: Self-pay

## 2018-11-21 ENCOUNTER — Encounter: Payer: Self-pay | Admitting: Internal Medicine

## 2018-11-21 ENCOUNTER — Telehealth: Payer: Self-pay

## 2018-11-21 ENCOUNTER — Ambulatory Visit (INDEPENDENT_AMBULATORY_CARE_PROVIDER_SITE_OTHER): Payer: BLUE CROSS/BLUE SHIELD | Admitting: Internal Medicine

## 2018-11-21 VITALS — Ht 65.0 in | Wt 154.0 lb

## 2018-11-21 DIAGNOSIS — M1000 Idiopathic gout, unspecified site: Secondary | ICD-10-CM | POA: Insufficient documentation

## 2018-11-21 MED ORDER — COLCHICINE 0.6 MG PO TABS: 0.6000 mg | ORAL_TABLET | Freq: Two times a day (BID) | ORAL | 0 refills | Status: DC

## 2018-11-21 NOTE — Progress Notes (Signed)
Date:  11/21/2018   Name:  Phyllis Murphy   DOB:  July 17, 1966   MRN:  [id number]  I connected with this patient, Phyllis Murphy, by telephone at the patient's home . I verified that I am speaking with the correct person using two identifiers. This visit was conducted via telephone due to the Covid-19 outbreak from my office at [facility] in [place], [place]. I discussed the limitations, risks, security and privacy concerns of performing an evaluation and management service by telephone. I also discussed with the patient that there may be a patient responsible charge related to this service. The patient expressed understanding and agreed to proceed.  Chief Complaint: Gout (Last Had 2017 Redness and swelling and pain level 8)  Foot Pain - she was first seen 3 years ago for pain in the right foot at the base of the big toe. It was hot and red and swollen. Xrays negative.  It was thought to possible be gout but no labs were done.  She has had several episodes over the past few years and was using the previous Rx of colchicine. She uses it for several days with good effect.  One episode was in her wrist. This episode started Saturday and she had 2 pills left and she took them.  She has gotten partial relief with that. It is in her right great toe with pain on palpation and weight bearing.  She can not wear shoes.  She had called for a refill but I needed to discuss this further to be more certain that she does have gout.  Review of Systems  Constitutional: Negative for chills and fever.  Respiratory: Negative for chest tightness and shortness of breath.   Cardiovascular: Negative for chest pain and palpitations.  Musculoskeletal: Positive for gait problem and joint swelling. Negative for arthralgias.  Neurological: Negative for dizziness and headaches.    Patient Active Problem List   Diagnosis Date Noted  . Drug-induced constipation 11/14/2018  . Ganglion cyst of dorsum of left wrist  06/20/2018  . Mixed incontinence urge and stress 06/20/2018  . Tinea versicolor 06/20/2018  . Mixed hyperlipidemia 07/13/2015  . Tobacco abuse 07/13/2015  . Cervical spinal stenosis 04/04/2012  . Essential hypertension 04/04/2012  . Back ache 02/03/2012  . Hand numbness 02/03/2012    Allergies  Allergen Reactions  . Esmolol Anaphylaxis  . Latex Anaphylaxis  . Bacitracin Hives and Itching  . Other Hives    dermabond  . Sulfa Antibiotics Hives and Itching    Past Surgical History:  Procedure Laterality Date  . ABDOMINAL HYSTERECTOMY    . AUGMENTATION MAMMAPLASTY Bilateral 2004  . BREAST ENHANCEMENT SURGERY    . CERVICAL FUSION      Social History   Tobacco Use  . Smoking status: Current Every Day Smoker    Packs/day: 1.00    Years: 35.00    Pack years: 35.00    Types: Cigarettes    Start date: 08/22/1980  . Smokeless tobacco: Never Used  Substance Use Topics  . Alcohol use: No    Alcohol/week: 0.0 standard drinks  . Drug use: No     Medication list has been reviewed and updated.  Current Meds  Medication Sig  . amoxicillin-clavulanate (AUGMENTIN) 875-125 MG tablet Take 1 tablet by mouth 2 (two) times daily for 10 days.  . cyclobenzaprine (FLEXERIL) 10 MG tablet Take 10 mg by mouth 4 (four) times daily.   . fexofenadine (ALLEGRA) 180 MG tablet Take 180  mg by mouth daily.  Marland Kitchen lisinopril (PRINIVIL,ZESTRIL) 20 MG tablet Take 1 tablet (20 mg total) by mouth daily.  Marcene Duos Tosylate (SYMPROIC) 0.2 MG TABS Take 1 tablet by mouth daily.  Marland Kitchen oxyCODONE-acetaminophen (PERCOCET) 7.5-325 MG tablet TAKE 1 TABLET BY MOUTH TWICE A DAY FOR TOTAL DOSE OF 7.5/5/7.5/5. TO FILL 01-15-16.  Marland Kitchen oxyCODONE-acetaminophen (PERCOCET/ROXICET) 5-325 MG tablet Take 1 tablet by mouth 2 (two) times daily.    PHQ 2/9 Scores 11/14/2018 06/20/2018 06/14/2017 01/21/2016  PHQ - 2 Score 0 0 0 0    BP Readings from Last 3 Encounters:  11/14/18 128/80  06/20/18 128/80  06/14/17 122/80     Physical Exam Pulmonary:     Comments: No dyspnea noted Psychiatric:        Attention and Perception: Attention normal.        Mood and Affect: Mood normal.        Behavior: Behavior is cooperative.     Wt Readings from Last 3 Encounters:  11/21/18 154 lb (69.9 kg)  11/14/18 154 lb (69.9 kg)  06/20/18 154 lb (69.9 kg)    Ht 5\' 5"  (1.651 m)   Wt 154 lb (69.9 kg)   BMI 25.63 kg/m   Assessment and Plan: 1. Idiopathic gout, unspecified chronicity, unspecified site Continue colchicine bid x 5 days max as needed If recurrent, may need testing and daily prophy Discussed dietary role in gout and recommended changes - colchicine 0.6 MG tablet; Take 1 tablet (0.6 mg total) by mouth 2 (two) times daily.  Dispense: 30 tablet; Refill: 0  I spent 8 minutes on this encounter.  Partially dictated using Animal nutritionist. Any errors are unintentional.  Bari Edward, MD [facility]  11/21/2018

## 2018-11-22 NOTE — Telephone Encounter (Signed)
ADVISED

## 2018-11-24 ENCOUNTER — Other Ambulatory Visit: Payer: Self-pay | Admitting: Internal Medicine

## 2018-11-24 DIAGNOSIS — N3946 Mixed incontinence: Secondary | ICD-10-CM

## 2018-12-03 ENCOUNTER — Other Ambulatory Visit: Payer: Self-pay

## 2018-12-03 DIAGNOSIS — M1000 Idiopathic gout, unspecified site: Secondary | ICD-10-CM

## 2018-12-03 MED ORDER — COLCHICINE 0.6 MG PO TABS: 0.6000 mg | ORAL_TABLET | Freq: Two times a day (BID) | ORAL | 0 refills | Status: DC

## 2018-12-04 ENCOUNTER — Other Ambulatory Visit: Payer: Self-pay | Admitting: Internal Medicine

## 2018-12-04 DIAGNOSIS — I10 Essential (primary) hypertension: Secondary | ICD-10-CM

## 2018-12-05 DIAGNOSIS — Z79899 Other long term (current) drug therapy: Secondary | ICD-10-CM | POA: Diagnosis not present

## 2018-12-05 DIAGNOSIS — M5412 Radiculopathy, cervical region: Secondary | ICD-10-CM | POA: Diagnosis not present

## 2018-12-05 DIAGNOSIS — G894 Chronic pain syndrome: Secondary | ICD-10-CM | POA: Diagnosis not present

## 2018-12-05 DIAGNOSIS — M5416 Radiculopathy, lumbar region: Secondary | ICD-10-CM | POA: Diagnosis not present

## 2018-12-17 ENCOUNTER — Telehealth: Payer: Self-pay | Admitting: Gastroenterology

## 2018-12-17 ENCOUNTER — Other Ambulatory Visit: Payer: Self-pay

## 2018-12-17 DIAGNOSIS — Z1211 Encounter for screening for malignant neoplasm of colon: Secondary | ICD-10-CM

## 2018-12-17 NOTE — Telephone Encounter (Signed)
Gastroenterology Pre-Procedure Review  Request Date: [facility] 03/06/2019    [facility] Requesting Physician: Dr. Allegra Lai  PATIENT REVIEW QUESTIONS: The patient responded to the following health history questions as indicated:    1. Are you having any GI issues? no 2. Do you have a personal history of Polyps? no 3. Do you have a family history of Colon Cancer or Polyps? no 4. Diabetes Mellitus? no 5. Joint replacements in the past 12 months?no 6. Major health problems in the past 3 months?no 7. Any artificial heart valves, MVP, or defibrillator?no    MEDICATIONS & ALLERGIES:    Patient reports the following regarding taking any anticoagulation/antiplatelet therapy:   Plavix, Coumadin, Eliquis, Xarelto, Lovenox, Pradaxa, Brilinta, or Effient? no Aspirin? no  Patient confirms/reports the following medications:  Current Outpatient Medications  Medication Sig Dispense Refill  . colchicine 0.6 MG tablet Take 1 tablet (0.6 mg total) by mouth 2 (two) times daily. 30 tablet 0  . cyclobenzaprine (FLEXERIL) 10 MG tablet Take 10 mg by mouth 4 (four) times daily.   2  . fexofenadine (ALLEGRA) 180 MG tablet Take 180 mg by mouth daily.    Marland Kitchen lisinopril (PRINIVIL,ZESTRIL) 20 MG tablet TAKE 1 TABLET BY MOUTH  DAILY 90 tablet 3  . Naldemedine Tosylate (SYMPROIC) 0.2 MG TABS Take 1 tablet by mouth daily. 30 tablet 0  . oxyCODONE-acetaminophen (PERCOCET) 7.5-325 MG tablet TAKE 1 TABLET BY MOUTH TWICE A DAY FOR TOTAL DOSE OF 7.5/5/7.5/5. TO FILL 01-15-16.  0  . oxyCODONE-acetaminophen (PERCOCET/ROXICET) 5-325 MG tablet Take 1 tablet by mouth 2 (two) times daily.     No current facility-administered medications for this visit.     Patient confirms/reports the following allergies:  Allergies  Allergen Reactions  . Esmolol Anaphylaxis  . Latex Anaphylaxis  . Bacitracin Hives and Itching  . Other Hives    dermabond  . Sulfa Antibiotics Hives and Itching    No orders of the defined types were placed in this  encounter.   AUTHORIZATION INFORMATION Primary Insurance: 1D#: Group #:  Secondary Insurance: 1D#: Group #:  SCHEDULE INFORMATION: Date:  Time: Location:

## 2019-02-20 DIAGNOSIS — M5416 Radiculopathy, lumbar region: Secondary | ICD-10-CM | POA: Diagnosis not present

## 2019-02-20 DIAGNOSIS — M5412 Radiculopathy, cervical region: Secondary | ICD-10-CM | POA: Diagnosis not present

## 2019-02-22 ENCOUNTER — Other Ambulatory Visit
Admission: RE | Admit: 2019-02-22 | Discharge: 2019-02-22 | Disposition: A | Discharge status: Home / Self Care | Payer: BLUE CROSS/BLUE SHIELD | Source: Ambulatory Visit | Attending: Gastroenterology | Admitting: Gastroenterology

## 2019-02-25 ENCOUNTER — Telehealth: Payer: Self-pay

## 2019-02-25 DIAGNOSIS — Z01812 Encounter for preprocedural laboratory examination: Secondary | ICD-10-CM

## 2019-02-25 NOTE — Telephone Encounter (Signed)
LVM  Advising patient to report to [place] on 07/17 between the hours of 10:30am and 12:30pm for required COVID testing required for Colonoscopy scheduled for 03/13/19.  Thanks [employer]

## 2019-03-13 ENCOUNTER — Ambulatory Visit
Admission: RE | Admit: 2019-03-13 | Payer: BC Managed Care – PPO | Source: Home / Self Care | Admitting: Gastroenterology

## 2019-03-13 ENCOUNTER — Encounter: Admission: RE | Payer: Self-pay | Source: Home / Self Care

## 2019-03-13 DIAGNOSIS — M5416 Radiculopathy, lumbar region: Secondary | ICD-10-CM | POA: Diagnosis not present

## 2019-03-13 SURGERY — COLONOSCOPY WITH PROPOFOL
Anesthesia: Choice

## 2019-04-23 ENCOUNTER — Ambulatory Visit: Payer: BLUE CROSS/BLUE SHIELD | Admitting: Internal Medicine

## 2019-04-24 DIAGNOSIS — M5412 Radiculopathy, cervical region: Secondary | ICD-10-CM | POA: Diagnosis not present

## 2019-05-08 DIAGNOSIS — R300 Dysuria: Secondary | ICD-10-CM | POA: Diagnosis not present

## 2019-05-08 DIAGNOSIS — N390 Urinary tract infection, site not specified: Secondary | ICD-10-CM | POA: Diagnosis not present

## 2019-08-14 ENCOUNTER — Other Ambulatory Visit: Payer: Self-pay | Admitting: Internal Medicine

## 2019-08-14 DIAGNOSIS — I10 Essential (primary) hypertension: Secondary | ICD-10-CM

## 2019-08-14 MED ORDER — LISINOPRIL 20 MG PO TABS: 20.0000 mg | ORAL_TABLET | Freq: Every day | ORAL | 3 refills | Status: DC

## 2019-11-15 ENCOUNTER — Encounter: Payer: BLUE CROSS/BLUE SHIELD | Admitting: Internal Medicine

## 2019-11-15 ENCOUNTER — Telehealth: Payer: Self-pay

## 2019-11-15 NOTE — Telephone Encounter (Signed)
Patient came into the office for her CPE today. She said that she refuses to wear a mask. Explained to her the [facility] policy and that she is required to wear a mask in the building. She seemed upset but said she will not wear a mask and that she cannot wear a mask because Dr. Judithann Graves will not prescribe her anxiety attack medicine. Explained to her that we do not typically prescribe anxiety medicine and that she may need to see psychiatry for anxiety, but if she cannot wear a mask then we will need to reschedule her appt. She said she will call back and reschedule.   CM

## 2019-11-15 NOTE — Progress Notes (Deleted)
Date:  11/15/2019   Name:  Phyllis Murphy   DOB:  1965-09-30   MRN:  [id number]   Chief Complaint: No chief complaint on file. Phyllis Murphy is a 54 y.o. female who presents today for her Complete Annual Exam. She feels {DESC; WELL/FAIRLY WELL/POORLY:18703}. She reports exercising ***. She reports she is sleeping {DESC; WELL/FAIRLY WELL/POORLY:18703}. She denies breast issues.  She is overdue for a mammogram.  Mammogram  06/2018 Pap discontinued Colonoscopy -none Immunization History  Administered Date(s) Administered  . Influenza Inj Mdck Quad With Preservative 05/31/2018  . Influenza,inj,Quad PF,6+ Mos 07/29/2015  . Influenza-Unspecified 06/06/2018  . Pneumococcal Polysaccharide-23 03/07/2016    Hypertension This is a chronic problem. The problem is controlled. Past treatments include ACE inhibitors. The current treatment provides significant improvement. There are no compliance problems.     Lab Results  Component Value Date   CREATININE 0.73 11/14/2018   BUN 5 (L) 11/14/2018   NA 141 11/14/2018   K 4.9 11/14/2018   CL 103 11/14/2018   CO2 21 11/14/2018   Lab Results  Component Value Date   CHOL 221 (H) 11/14/2018   HDL 55 11/14/2018   LDLCALC 135 (H) 11/14/2018   TRIG 156 (H) 11/14/2018   CHOLHDL 4.0 11/14/2018   Lab Results  Component Value Date   TSH 1.720 11/14/2018   No results found for: HGBA1C Lab Results  Component Value Date   WBC 8.5 11/14/2018   HGB 13.5 11/14/2018   HCT 39.8 11/14/2018   MCV 89 11/14/2018   PLT 391 11/14/2018   Lab Results  Component Value Date   ALT 7 11/14/2018   AST 13 11/14/2018   ALKPHOS 66 11/14/2018   BILITOT <0.2 11/14/2018     Review of Systems  Patient Active Problem List   Diagnosis Date Noted  . Idiopathic gout 11/21/2018  . Drug-induced constipation 11/14/2018  . Ganglion cyst of dorsum of left wrist 06/20/2018  . Mixed incontinence urge and stress 06/20/2018  . Tinea versicolor 06/20/2018    . Mixed hyperlipidemia 07/13/2015  . Tobacco abuse 07/13/2015  . Cervical spinal stenosis 04/04/2012  . Essential hypertension 04/04/2012  . Back ache 02/03/2012  . Hand numbness 02/03/2012    Allergies  Allergen Reactions  . Esmolol Anaphylaxis  . Latex Anaphylaxis  . Bacitracin Hives and Itching  . Other Hives    dermabond  . Sulfa Antibiotics Hives and Itching    Past Surgical History:  Procedure Laterality Date  . ABDOMINAL HYSTERECTOMY    . AUGMENTATION MAMMAPLASTY Bilateral 2004  . BREAST ENHANCEMENT SURGERY    . CERVICAL FUSION      Social History   Tobacco Use  . Smoking status: Current Every Day Smoker    Packs/day: 1.00    Years: 35.00    Pack years: 35.00    Types: Cigarettes    Start date: 08/22/1980  . Smokeless tobacco: Never Used  Substance Use Topics  . Alcohol use: No    Alcohol/week: 0.0 standard drinks  . Drug use: No     Medication list has been reviewed and updated.  No outpatient medications have been marked as taking for the 11/15/19 encounter (Appointment) with Reubin Milan, MD.    [facility] 2/9 Scores 11/14/2018 06/20/2018 06/14/2017 01/21/2016  PHQ - 2 Score 0 0 0 0    BP Readings from Last 3 Encounters:  11/14/18 128/80  06/20/18 128/80  06/14/17 122/80    Physical Exam  Wt Readings from Last  3 Encounters:  11/21/18 154 lb (69.9 kg)  11/14/18 154 lb (69.9 kg)  06/20/18 154 lb (69.9 kg)    There were no vitals taken for this visit.  Assessment and Plan:

## 2020-04-29 ENCOUNTER — Other Ambulatory Visit: Payer: Self-pay

## 2020-04-29 DIAGNOSIS — Z20822 Contact with and (suspected) exposure to covid-19: Secondary | ICD-10-CM

## 2020-05-01 LAB — SARS-COV-2, NAA 2 DAY TAT

## 2020-05-01 LAB — NOVEL CORONAVIRUS, NAA: SARS-CoV-2, NAA: NOT DETECTED

## 2020-10-26 ENCOUNTER — Other Ambulatory Visit: Payer: Self-pay | Admitting: Internal Medicine

## 2020-10-26 ENCOUNTER — Telehealth: Payer: Self-pay

## 2020-10-26 DIAGNOSIS — I10 Essential (primary) hypertension: Secondary | ICD-10-CM

## 2020-10-26 NOTE — Telephone Encounter (Signed)
Pt needs an appt for BP. Pt needs appt to get refills on medication.  KP

## 2020-10-26 NOTE — Telephone Encounter (Signed)
Pt was called and informed that we need to set up appointment for med refills she asked if we are still wearing masks and front desk said yes we are she stated that she will find another doctor because she has some kind of phobia to where she cant wear one.  KP

## 2020-10-26 NOTE — Telephone Encounter (Signed)
Requested medication (s) are due for refill today: yes  Requested medication (s) are on the active medication list: yes  Last refill:  08/01/2020  Future visit scheduled: no  Notes to clinic:  overdue for follow up and labs  Requested Prescriptions  Pending Prescriptions Disp Refills   lisinopril (ZESTRIL) 20 MG tablet [Pharmacy Med Name: LISINOPRIL 20 MG TABLET] 90 tablet 3    Sig: TAKE 1 TABLET BY MOUTH EVERY DAY      Cardiovascular:  ACE Inhibitors Failed - 10/26/2020  1:31 AM      Failed - Cr in normal range and within 180 days    Creatinine  Date Value Ref Range Status  10/27/2013 0.62 0.60 - 1.30 mg/dL Final   Creatinine, Ser  Date Value Ref Range Status  11/14/2018 0.73 0.57 - 1.00 mg/dL Final          Failed - K in normal range and within 180 days    Potassium  Date Value Ref Range Status  11/14/2018 4.9 3.5 - 5.2 mmol/L Final  10/27/2013 3.5 3.5 - 5.1 mmol/L Final          Failed - Valid encounter within last 6 months    Recent Outpatient Visits           1 year ago Idiopathic gout, unspecified chronicity, unspecified site   [facility] Reubin Milan, MD   1 year ago Annual physical exam   [facility] Reubin Milan, MD   2 years ago Essential hypertension   [facility] Reubin Milan, MD   3 years ago Annual physical exam   [facility] Reubin Milan, MD   4 years ago Essential hypertension   [facility] Reubin Milan, MD                Passed - Patient is not pregnant      Passed - Last BP in normal range    BP Readings from Last 1 Encounters:  11/14/18 128/80

## 2020-10-26 NOTE — Telephone Encounter (Signed)
Noted  KP 

## 2020-10-26 NOTE — Telephone Encounter (Signed)
Called patient to inform her that we need to set up appointment for med refills she asked if we are still wearing masks and I said yes we are she stated that she will find another doctor because she has some kind of phobia to wear she cant wear one.

## 2020-12-03 ENCOUNTER — Ambulatory Visit: Payer: 59 | Admitting: Physician Assistant

## 2020-12-03 ENCOUNTER — Other Ambulatory Visit: Payer: Self-pay

## 2020-12-03 ENCOUNTER — Encounter: Payer: Self-pay | Admitting: Physician Assistant

## 2020-12-03 DIAGNOSIS — J302 Other seasonal allergic rhinitis: Secondary | ICD-10-CM

## 2020-12-03 DIAGNOSIS — G894 Chronic pain syndrome: Secondary | ICD-10-CM

## 2020-12-03 DIAGNOSIS — R5383 Other fatigue: Secondary | ICD-10-CM

## 2020-12-03 DIAGNOSIS — F17219 Nicotine dependence, cigarettes, with unspecified nicotine-induced disorders: Secondary | ICD-10-CM | POA: Diagnosis not present

## 2020-12-03 DIAGNOSIS — Z7689 Persons encountering health services in other specified circumstances: Secondary | ICD-10-CM

## 2020-12-03 DIAGNOSIS — F411 Generalized anxiety disorder: Secondary | ICD-10-CM

## 2020-12-03 DIAGNOSIS — F410 Panic disorder [episodic paroxysmal anxiety] without agoraphobia: Secondary | ICD-10-CM

## 2020-12-03 DIAGNOSIS — I10 Essential (primary) hypertension: Secondary | ICD-10-CM

## 2020-12-03 MED ORDER — LISINOPRIL 20 MG PO TABS: 20.0000 mg | ORAL_TABLET | Freq: Every day | ORAL | 3 refills | Status: DC

## 2020-12-03 MED ORDER — SERTRALINE HCL 25 MG PO TABS: 25.0000 mg | ORAL_TABLET | Freq: Every day | ORAL | 3 refills | Status: DC
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
## 2020-12-10 ENCOUNTER — Other Ambulatory Visit: Payer: Self-pay

## 2020-12-10 ENCOUNTER — Encounter: Payer: Self-pay | Admitting: Emergency Medicine

## 2020-12-10 ENCOUNTER — Emergency Department
Admission: EM | Admit: 2020-12-10 | Discharge: 2020-12-10 | Disposition: A | Discharge status: Home / Self Care | Payer: 59 | Attending: Emergency Medicine | Admitting: Emergency Medicine

## 2020-12-10 DIAGNOSIS — Z79899 Other long term (current) drug therapy: Secondary | ICD-10-CM | POA: Insufficient documentation

## 2020-12-10 DIAGNOSIS — I10 Essential (primary) hypertension: Secondary | ICD-10-CM | POA: Diagnosis not present

## 2020-12-10 DIAGNOSIS — L299 Pruritus, unspecified: Secondary | ICD-10-CM | POA: Diagnosis not present

## 2020-12-10 DIAGNOSIS — F17210 Nicotine dependence, cigarettes, uncomplicated: Secondary | ICD-10-CM | POA: Diagnosis not present

## 2020-12-10 LAB — CBC WITH DIFFERENTIAL
Basophils Absolute: 0 10*3/uL (ref 0.0–0.2)
Basos: 0 %
EOS (ABSOLUTE): 0 10*3/uL (ref 0.0–0.4)
Eos: 1 %
Hematocrit: 38 % (ref 34.0–46.6)
Hemoglobin: 12.4 g/dL (ref 11.1–15.9)
Immature Grans (Abs): 0 10*3/uL (ref 0.0–0.1)
Immature Granulocytes: 0 %
Lymphocytes Absolute: 1.9 10*3/uL (ref 0.7–3.1)
Lymphs: 37 %
MCH: 28.6 pg (ref 26.6–33.0)
MCHC: 32.6 g/dL (ref 31.5–35.7)
MCV: 88 fL (ref 79–97)
Monocytes Absolute: 0.3 10*3/uL (ref 0.1–0.9)
Monocytes: 7 %
Neutrophils Absolute: 2.8 10*3/uL (ref 1.4–7.0)
Neutrophils: 55 %
RBC: 4.34 x10E6/uL (ref 3.77–5.28)
RDW: 14.9 % (ref 11.7–15.4)
WBC: 5.1 10*3/uL (ref 3.4–10.8)

## 2020-12-10 LAB — COMPREHENSIVE METABOLIC PANEL
ALT: 11 IU/L (ref 0–32)
AST: 15 IU/L (ref 0–40)
Albumin/Globulin Ratio: 2.1 (ref 1.2–2.2)
Albumin: 4.6 g/dL (ref 3.8–4.9)
Alkaline Phosphatase: 77 IU/L (ref 44–121)
BUN/Creatinine Ratio: 15 (ref 9–23)
BUN: 9 mg/dL (ref 6–24)
Bilirubin Total: <0.2 mg/dL (ref 0.0–1.2)
CO2: 21 mmol/L (ref 20–29)
Calcium: 9.5 mg/dL (ref 8.7–10.2)
Chloride: 101 mmol/L (ref 96–106)
Creatinine, Ser: 0.62 mg/dL (ref 0.57–1.00)
Globulin, Total: 2.2 g/dL (ref 1.5–4.5)
Glucose: 82 mg/dL (ref 65–99)
Potassium: 5.1 mmol/L (ref 3.5–5.2)
Sodium: 139 mmol/L (ref 134–144)
Total Protein: 6.8 g/dL (ref 6.0–8.5)
eGFR: 107 mL/min/{1.73_m2} (ref >59)

## 2020-12-10 LAB — TSH+FREE T4
Free T4: 1.09 ng/dL (ref 0.82–1.77)
TSH: 2.82 u[IU]/mL (ref 0.450–4.500)

## 2020-12-10 LAB — LIPID PANEL WITH LDL/HDL RATIO
Cholesterol, Total: 193 mg/dL (ref 100–199)
HDL: 54 mg/dL (ref >39)
LDL Chol Calc (NIH): 95 mg/dL (ref 0–99)
LDL/HDL Ratio: 1.8 ratio (ref 0.0–3.2)
Triglycerides: 262 mg/dL — ABNORMAL HIGH (ref 0–149)
VLDL Cholesterol Cal: 44 mg/dL — ABNORMAL HIGH (ref 5–40)

## 2020-12-10 MED ORDER — PERMETHRIN 5 % EX CREA: 1.0000 "application " | TOPICAL_CREAM | Freq: Once | CUTANEOUS | 0 refills | Status: AC

## 2020-12-10 MED ORDER — HYDROXYZINE HCL 25 MG PO TABS: 25.0000 mg | ORAL_TABLET | Freq: Three times a day (TID) | ORAL | 0 refills | Status: DC | PRN

## 2020-12-10 NOTE — ED Triage Notes (Signed)
Pt in via POV, states "I have bugs crawling out of my skin."  Reports itching x approximately 3 weeks and then noticing things crawling out of skin today.    Pt proceeds to place lotion on the palms of hands, small black particles do appear to skin, patient then wipes with a tissue, black particles appear as lint to this RN.    Pt reports urgent care sent me here because they do not have a microscope.  States, "If you have a microscope and tell me that's lint, I will go home."  Ambulatory to triage, NAD noted at this time.

## 2020-12-10 NOTE — ED Provider Notes (Signed)
[facility] Emergency Department Provider Note  Time seen: 10:44 PM  I have reviewed the triage vital signs and the nursing notes.   HISTORY  Chief Complaint Pruritis   HPI Phyllis Murphy is a 55 y.o. female with a past medical history of hypertension, gout, presents to the emergency department for itching hands and arms.  According to the patient for the past 3 weeks she has been experiencing itching mostly on the palms of her hands but also back of her hands and forearms at times.  Patient states the itching has gotten worse.  She is now seeing small black specks on her hands which she thinks could be bugs crawling out of her hands.  Went to urgent care but they did not have a microscope to evaluate so they sent her here.  Here the patient is awake alert, she is continuously itching her hands.  Continues to moisturize her hands frequently throughout the evaluation.  Past Medical History:  Diagnosis Date  . DDD (degenerative disc disease), cervical   . History of breast augmentation    2007  . Hx of hysterectomy   . Hypertension     Patient Active Problem List   Diagnosis Date Noted  . Idiopathic gout 11/21/2018  . Drug-induced constipation 11/14/2018  . Ganglion cyst of dorsum of left wrist 06/20/2018  . Mixed incontinence urge and stress 06/20/2018  . Tinea versicolor 06/20/2018  . Mixed hyperlipidemia 07/13/2015  . Tobacco abuse 07/13/2015  . Cervical spinal stenosis 04/04/2012  . Essential hypertension 04/04/2012  . Back ache 02/03/2012  . Hand numbness 02/03/2012    Past Surgical History:  Procedure Laterality Date  . ABDOMINAL HYSTERECTOMY    . AUGMENTATION MAMMAPLASTY Bilateral 2004  . BREAST ENHANCEMENT SURGERY    . CERVICAL FUSION    . ELBOW SURGERY     two surgeries 6 yrs ago 2017    Prior to Admission medications   Medication Sig Start Date End Date Taking? Authorizing Provider  colchicine 0.6 MG tablet Take 1 tablet (0.6 mg  total) by mouth 2 (two) times daily. 12/03/18   Reubin Milan, MD  cyclobenzaprine (FLEXERIL) 10 MG tablet Take 10 mg by mouth 4 (four) times daily.  03/05/15   [provider]  fexofenadine (ALLEGRA) 180 MG tablet Take 180 mg by mouth daily.    [provider]  lisinopril (ZESTRIL) 20 MG tablet Take 1 tablet (20 mg total) by mouth daily. 12/03/20   McDonough, Salomon Fick, PA-C  oxyCODONE-acetaminophen (PERCOCET) 7.5-325 MG tablet TAKE 1 TABLET BY MOUTH TWICE A DAY FOR TOTAL DOSE OF 7.5/5/7.5/5. TO FILL 01-15-16. 01/15/16   [provider]  sertraline (ZOLOFT) 25 MG tablet Take 1 tablet (25 mg total) by mouth daily. 12/03/20   McDonough, Salomon Fick, PA-C    Allergies  Allergen Reactions  . Esmolol Anaphylaxis  . Latex Anaphylaxis  . Bacitracin Hives and Itching  . Other Hives    Dermabond  . Sulfa Antibiotics Hives and Itching    Family History  Problem Relation Age of Onset  . Brain cancer Mother   . Hypertension Father   . Diabetes Father   . Stroke Father   . Breast cancer Neg Hx     Social History Social History   Tobacco Use  . Smoking status: Current Every Day Smoker    Packs/day: 1.00    Years: 35.00    Pack years: 35.00    Types: Cigarettes    Start date:  08/22/1980  . Smokeless tobacco: Never Used  Vaping Use  . Vaping Use: Never used  Substance Use Topics  . Alcohol use: No    Alcohol/week: 0.0 standard drinks  . Drug use: No    Review of Systems Constitutional: Negative for fever. Cardiovascular: Negative for chest pain. Respiratory: Negative for shortness of breath. Gastrointestinal: Negative for abdominal pain, vomiting Musculoskeletal: Negative for musculoskeletal complaints Skin: Itching hands and forearms Neurological: Negative for headache All other ROS negative  ____________________________________________   PHYSICAL EXAM:  VITAL SIGNS: ED Triage Vitals  Enc Vitals Group     BP 12/10/20 2026 (!) 158/110     Pulse  Rate 12/10/20 2026 94     Resp 12/10/20 2026 16     Temp 12/10/20 2026 97.7 F (36.5 C)     Temp Source 12/10/20 2026 Oral     SpO2 12/10/20 2026 98 %     Weight 12/10/20 2027 120 lb (54.4 kg)     Height 12/10/20 2027 5\' 5"  (1.651 m)     Head Circumference --      Peak Flow --      Pain Score 12/10/20 2027 7     Pain Loc --      Pain Edu? --      Excl. in GC? --    Constitutional: Alert and oriented. Well appearing and in no distress. Eyes: Normal exam ENT      Head: Normocephalic and atraumatic.      Mouth/Throat: Mucous membranes are moist. Cardiovascular: Normal rate, regular rhythm.  Respiratory: Normal respiratory effort without tachypnea nor retractions. Breath sounds are clear  Gastrointestinal: Soft and nontender. No distention.  T Musculoskeletal: Nontender with normal range of motion in all extremities.  Neurologic:  Normal speech and language. No gross focal neurologic deficits Skin: Patient does have erythematous palms back of her hand and somewhat on her forearms but she is actively itching all these areas.  A small little black specks that she keeps pointing to on her hands do not appear to be insects/bugs appear more consistent with lint. Psychiatric: Mood and affect are normal.   ____________________________________________    INITIAL IMPRESSION / ASSESSMENT AND PLAN / ED COURSE  Pertinent labs & imaging results that were available during my care of the patient were reviewed by me and considered in my medical decision making (see chart for details).   Patient presents emergency department for 3 weeks of itching mostly in the palms but also on the back of her hands and forearms.  The area is erythematous but the patient is actively itching these areas and continuously moisturizes these areas.  Patient thinks she sees small insects which she points to do not appear to be insect but appear to be more like lint.  However given the patient 3 weeks of itching and the  erythema I do believe it would be reasonable to treat the patient for suspected body lice with permethrin.  I discussed with the patient how to use this medication leave it on overnight and wash off in the morning.  As well as washing all of her linens and close in hot water and drying a hot heat.  I also discussed dermatology follow-up with the patient.  Patient agreeable to plan of care.  ANNELISA RYBACK was evaluated in Emergency Department on 12/10/2020 for the symptoms described in the history of present illness. She was evaluated in the context of the global COVID-19 pandemic, which necessitated consideration that the patient  might be at risk for infection with the SARS-CoV-2 virus that causes COVID-19. Institutional protocols and algorithms that pertain to the evaluation of patients at risk for COVID-19 are in a state of rapid change based on information released by regulatory bodies including the CDC and federal and state organizations. These policies and algorithms were followed during the patient's care in the ED.  ____________________________________________   FINAL CLINICAL IMPRESSION(S) / ED DIAGNOSES  Pruritus   Minna Antis, MD 12/10/20 [id number]

## 2020-12-10 NOTE — Discharge Instructions (Signed)
Please apply cream.  Leave in place for 8 hours and shower off.

## 2021-01-13 ENCOUNTER — Encounter: Payer: Self-pay | Admitting: Emergency Medicine

## 2021-01-15 ENCOUNTER — Encounter: Payer: Self-pay | Admitting: Nurse Practitioner

## 2021-02-03 ENCOUNTER — Encounter: Payer: Self-pay | Admitting: Nurse Practitioner

## 2021-02-03 ENCOUNTER — Ambulatory Visit (INDEPENDENT_AMBULATORY_CARE_PROVIDER_SITE_OTHER): Payer: 59 | Admitting: Nurse Practitioner

## 2021-02-03 ENCOUNTER — Other Ambulatory Visit: Payer: Self-pay

## 2021-02-03 VITALS — BP 124/84 | HR 115 | Temp 97.4°F | Resp 16 | Ht 64.0 in | Wt 118.2 lb

## 2021-02-03 DIAGNOSIS — Z720 Tobacco use: Secondary | ICD-10-CM

## 2021-02-03 DIAGNOSIS — F411 Generalized anxiety disorder: Secondary | ICD-10-CM

## 2021-02-03 DIAGNOSIS — E782 Mixed hyperlipidemia: Secondary | ICD-10-CM | POA: Diagnosis not present

## 2021-02-03 DIAGNOSIS — F410 Panic disorder [episodic paroxysmal anxiety] without agoraphobia: Secondary | ICD-10-CM

## 2021-02-03 DIAGNOSIS — Z23 Encounter for immunization: Secondary | ICD-10-CM

## 2021-02-03 DIAGNOSIS — I10 Essential (primary) hypertension: Secondary | ICD-10-CM | POA: Diagnosis not present

## 2021-02-03 DIAGNOSIS — Z0001 Encounter for general adult medical examination with abnormal findings: Secondary | ICD-10-CM

## 2021-02-03 DIAGNOSIS — Z1212 Encounter for screening for malignant neoplasm of rectum: Secondary | ICD-10-CM

## 2021-02-03 DIAGNOSIS — R300 Dysuria: Secondary | ICD-10-CM

## 2021-02-03 DIAGNOSIS — M1A00X0 Idiopathic chronic gout, unspecified site, without tophus (tophi): Secondary | ICD-10-CM | POA: Diagnosis not present

## 2021-02-03 DIAGNOSIS — Z1231 Encounter for screening mammogram for malignant neoplasm of breast: Secondary | ICD-10-CM

## 2021-02-03 DIAGNOSIS — Z1211 Encounter for screening for malignant neoplasm of colon: Secondary | ICD-10-CM

## 2021-02-03 MED ORDER — ALPRAZOLAM 0.25 MG PO TABS: 0.2500 mg | ORAL_TABLET | Freq: Every day | ORAL | 0 refills | Status: DC | PRN

## 2021-02-03 MED ORDER — TETANUS-DIPHTH-ACELL PERTUSSIS 5-2-15.5 LF-MCG/0.5 IM SUSP: 0.5000 mL | Freq: Once | INTRAMUSCULAR | 0 refills | Status: AC

## 2021-02-03 NOTE — Progress Notes (Signed)
[facility] [address] [place], [place] [address]  Internal MEDICINE  Office Visit Note  Patient Name: Phyllis Murphy  [id number]  [id number]  Date of Service: 02/06/2021  Chief Complaint  Patient presents with   Annual Exam   Hypertension   Quality Metric Gaps    Pneumovax, shingrix, mammogram, colonoscopy,     HPI Phyllis Murphy presents for an annual well visit and physical exam. she has a history of arthritis, hypertension, hysterectomy, breast augmentation surgery, and cervical fusion. She is due for colorectal cancer screening and screening mammogram. She would like to postpone any vaccinations at this time. She feels well  -age appropriate screenings and immunizations as appropriate -COVID vacc status -current problems, concerns.  Medication refills Routine labs Work and home life:  Pain:  Blood pressure good.   Mammogram - order [facility]  Cologard - order   Current Medication: Outpatient Encounter Medications as of 02/03/2021  Medication Sig Note   ALPRAZolam (XANAX) 0.25 MG tablet Take 1 tablet (0.25 mg total) by mouth daily as needed for anxiety.    colchicine 0.6 MG tablet Take 1 tablet (0.6 mg total) by mouth 2 (two) times daily.    cyclobenzaprine (FLEXERIL) 10 MG tablet Take 10 mg by mouth 4 (four) times daily.  04/07/2015: Received from: External Pharmacy   fexofenadine (ALLEGRA) 180 MG tablet Take 180 mg by mouth daily.    lisinopril (ZESTRIL) 20 MG tablet Take 1 tablet (20 mg total) by mouth daily.    oxyCODONE-acetaminophen (PERCOCET) 7.5-325 MG tablet TAKE 1 TABLET BY MOUTH TWICE A DAY FOR TOTAL DOSE OF 7.5/5/7.5/5. TO FILL 01-15-16. 01/21/2016: Received from: External Pharmacy   [EXPIRED] Tdap (ADACEL) 12-21-13.5 LF-MCG/0.5 injection Inject 0.5 mLs into the muscle once for 1 dose.    [DISCONTINUED] hydrOXYzine (ATARAX/VISTARIL) 25 MG tablet Take 1 tablet (25 mg total) by mouth 3 (three) times daily as needed for itching.    [DISCONTINUED] sertraline  (ZOLOFT) 25 MG tablet Take 1 tablet (25 mg total) by mouth daily. (Patient not taking: Reported on 02/03/2021)    No facility-administered encounter medications on file as of 02/03/2021.    Surgical History: Past Surgical History:  Procedure Laterality Date   ABDOMINAL HYSTERECTOMY     AUGMENTATION MAMMAPLASTY Bilateral 2004   BREAST ENHANCEMENT SURGERY     CERVICAL FUSION     ELBOW SURGERY     two surgeries 6 yrs ago 2017    Medical History: Past Medical History:  Diagnosis Date   DDD (degenerative disc disease), cervical    History of breast augmentation    2007   Hx of hysterectomy    Hypertension     Family History: Family History  Problem Relation Age of Onset   Brain cancer Mother    Hypertension Father    Diabetes Father    Stroke Father    Breast cancer Neg Hx     Social History   Socioeconomic History   Marital status: Married    Spouse name: Not on file   Number of children: Not on file   Years of education: Not on file   Highest education level: Not on file  Occupational History   Occupation: [employer] started 2019  Tobacco Use   Smoking status: Former    Packs/day: 1.00    Years: 35.00    Pack years: 35.00    Types: Cigarettes    Start date: 08/22/1980    Quit date: 11/20/2020    Years since quitting: 0.2  Smokeless tobacco: Never  Vaping Use   Vaping Use: Every day   Devices: no nictonie  Substance and Sexual Activity   Alcohol use: No    Alcohol/week: 0.0 standard drinks   Drug use: No   Sexual activity: Yes  Other Topics Concern   Not on file  Social History Narrative   ** Merged History Encounter **       Social Determinants of Health   Financial Resource Strain: Not on file  Food Insecurity: Not on file  Transportation Needs: Not on file  Physical Activity: Not on file  Stress: Not on file  Social Connections: Not on file  Intimate Partner Violence: Not on file      Review of Systems  Constitutional:  Negative for activity  change, appetite change, chills, fatigue, fever and unexpected weight change.  HENT: Negative.  Negative for congestion, ear pain, rhinorrhea, sore throat and trouble swallowing.   Eyes: Negative.   Respiratory: Negative.  Negative for cough, chest tightness, shortness of breath and wheezing.   Cardiovascular: Negative.  Negative for chest pain.  Gastrointestinal: Negative.  Negative for abdominal pain, blood in stool, constipation, diarrhea, nausea and vomiting.  Endocrine: Negative.   Genitourinary: Negative.  Negative for difficulty urinating, dysuria, frequency, hematuria and urgency.  Musculoskeletal: Negative.  Negative for arthralgias, back pain, joint swelling, myalgias and neck pain.  Skin: Negative.  Negative for rash and wound.  Allergic/Immunologic: Negative.  Negative for immunocompromised state.  Neurological: Negative.  Negative for dizziness, seizures, numbness and headaches.  Hematological: Negative.   Psychiatric/Behavioral: Negative.  Negative for behavioral problems, self-injury and suicidal ideas. The patient is not nervous/anxious.    Vital Signs: BP 124/84   Pulse (!) 115   Temp (!) 97.4 F (36.3 C)   Resp 16   Ht 5\' 4"  (1.626 m)   Wt 118 lb 3.2 oz (53.6 kg)   SpO2 99%   BMI 20.29 kg/m    Physical Exam Vitals reviewed.  Constitutional:      General: She is not in acute distress.    Appearance: Normal appearance. She is well-developed and normal weight. She is not ill-appearing or diaphoretic.  HENT:     Head: Normocephalic and atraumatic.     Right Ear: Tympanic membrane, ear canal and external ear normal.     Left Ear: Tympanic membrane, ear canal and external ear normal.     Nose: Nose normal.     Mouth/Throat:     Mouth: Mucous membranes are moist.     Pharynx: Oropharynx is clear. No oropharyngeal exudate.  Eyes:     Extraocular Movements: Extraocular movements intact.     Conjunctiva/sclera: Conjunctivae normal.     Pupils: Pupils are equal,  round, and reactive to light.  Neck:     Thyroid: No thyromegaly.     Vascular: No JVD.     Trachea: No tracheal deviation.  Cardiovascular:     Rate and Rhythm: Normal rate and regular rhythm.     Pulses: Normal pulses.     Heart sounds: Normal heart sounds. No murmur heard.   No friction rub. No gallop.  Pulmonary:     Effort: Pulmonary effort is normal. No respiratory distress.     Breath sounds: Normal breath sounds. No wheezing or rales.  Chest:     Chest wall: No tenderness.  Abdominal:     General: Abdomen is flat. Bowel sounds are normal.     Palpations: Abdomen is soft.  Musculoskeletal:  General: Normal range of motion.     Cervical back: Normal range of motion and neck supple.  Lymphadenopathy:     Cervical: No cervical adenopathy.  Skin:    General: Skin is warm and dry.     Capillary Refill: Capillary refill takes less than 2 seconds.  Neurological:     Mental Status: She is alert and oriented to person, place, and time.  Psychiatric:        Mood and Affect: Mood normal.        Behavior: Behavior normal.        Thought Content: Thought content normal.        Judgment: Judgment normal.    Assessment/Plan: 1. Encounter for general adult medical examination with abnormal findings Age-appropriate preventive screenings discussed, annual physical exam completed. Routine labs for health maintenance done in April 2022. Lab results discussed during today's visit. Patient is due for screening mammogram and screening colonoscopy. She declines getting either ordered or scheduled at this time. Will discuss with patient again at follow up visit in 2 months. Last mammogram was in 2019. No record of a colonoscopy.   2. Essential hypertension History of hypertension, well controlled with lisinopril 20 mg daily. No change in current medication regimen. No refill needed at this time.   3. Generalized anxiety disorder with panic attacks Patient reports increased anxiety  whenever she has to go to a facility or location that requires masks. She reports they make her feel claustrophobic and like she is having a panic attack. Alprazolam 0.25 mg 1 tablet daily as needed for anxiety and/or panic attack prescribed. Follow up with Lauren in 2 months to discuss and evaluate effectiveness.  - ALPRAZolam (XANAX) 0.25 MG tablet; Take 1 tablet (0.25 mg total) by mouth daily as needed for anxiety.  Dispense: 30 tablet; Refill: 0  4. Idiopathic chronic gout without tophus, unspecified site Stable with current medications, no changes in treatment regimen.   5. Mixed hyperlipidemia History of hyperlipidemia- VLDL was elevated and triglycerides, all other values were normal.   6. Tobacco abuse Recently quit smoking in April 2022, 35 pack years.   7. Need for prophylactic vaccination with tetanus-diphtheria (Td) Patient is due for her tetanus booster and agrees that she would like to received it, order sent to pharmacy.  - Tdap (ADACEL) 12-21-13.5 LF-MCG/0.5 injection; Inject 0.5 mLs into the muscle once for 1 dose.  Dispense: 0.5 mL; Refill: 0  8. Dysuria Routine urinalysis done.  - UA/M w/rflx Culture, Routine - Microscopic Examination   General Counseling: Josslyn verbalizes understanding of the findings of todays visit and agrees with plan of treatment. I have discussed any further diagnostic evaluation that may be needed or ordered today. We also reviewed her medications today. she has been encouraged to call the office with any questions or concerns that should arise related to todays visit.    Orders Placed This Encounter  Procedures   Microscopic Examination   UA/M w/rflx Culture, Routine    Meds ordered this encounter  Medications   ALPRAZolam (XANAX) 0.25 MG tablet    Sig: Take 1 tablet (0.25 mg total) by mouth daily as needed for anxiety.    Dispense:  30 tablet    Refill:  0   Tdap (ADACEL) 12-21-13.5 LF-MCG/0.5 injection    Sig: Inject 0.5 mLs into the  muscle once for 1 dose.    Dispense:  0.5 mL    Refill:  0    Return in about 2 months (  around 04/05/2021) for F/U, anxiety med refill with Leotis Shames PCP.   Total time spent:30 Minutes Time spent includes review of chart, medications, test results, and follow up plan with the patient.   [place] Controlled Substance Database was reviewed by me.  This patient was seen by Sallyanne Kuster, FNP-C in collaboration with Dr. Beverely Risen as a part of collaborative care agreement.  Lashai Grosch R. Tedd Sias, MSN, FNP-C Internal medicine

## 2021-02-04 LAB — MICROSCOPIC EXAMINATION
Bacteria, UA: NONE SEEN
Casts: NONE SEEN /lpf

## 2021-02-04 LAB — UA/M W/RFLX CULTURE, ROUTINE
Bilirubin, UA: NEGATIVE
Glucose, UA: NEGATIVE
Ketones, UA: NEGATIVE
Leukocytes,UA: NEGATIVE
Nitrite, UA: NEGATIVE
Protein,UA: NEGATIVE
RBC, UA: NEGATIVE
Specific Gravity, UA: 1.015 (ref 1.005–1.030)
Urobilinogen, Ur: 0.2 mg/dL (ref 0.2–1.0)
pH, UA: 6.5 (ref 5.0–7.5)

## 2021-02-06 DIAGNOSIS — F410 Panic disorder [episodic paroxysmal anxiety] without agoraphobia: Secondary | ICD-10-CM | POA: Insufficient documentation

## 2021-02-15 ENCOUNTER — Telehealth: Payer: Self-pay

## 2021-02-15 NOTE — Telephone Encounter (Signed)
Faxed cologuard requisition 02/15/2021

## 2021-02-18 ENCOUNTER — Telehealth: Payer: Self-pay

## 2021-02-18 NOTE — Telephone Encounter (Signed)
Cologuard refax on 02/18/2021 [facility] 

## 2021-03-26 LAB — COLOGUARD: COLOGUARD: NEGATIVE

## 2021-04-02 ENCOUNTER — Other Ambulatory Visit: Payer: Self-pay | Admitting: Nurse Practitioner

## 2021-04-02 DIAGNOSIS — F410 Panic disorder [episodic paroxysmal anxiety] without agoraphobia: Secondary | ICD-10-CM

## 2021-04-02 DIAGNOSIS — F411 Generalized anxiety disorder: Secondary | ICD-10-CM

## 2021-04-02 NOTE — Telephone Encounter (Signed)
Please review and fill if appropriate LOV: 02/03/21 NOV: 04/09/21

## 2021-04-09 ENCOUNTER — Ambulatory Visit: Payer: 59 | Admitting: Nurse Practitioner

## 2021-04-09 ENCOUNTER — Encounter: Payer: Self-pay | Admitting: Nurse Practitioner

## 2021-04-09 ENCOUNTER — Other Ambulatory Visit: Payer: Self-pay

## 2021-04-09 DIAGNOSIS — Z79899 Other long term (current) drug therapy: Secondary | ICD-10-CM | POA: Diagnosis not present

## 2021-04-09 DIAGNOSIS — I10 Essential (primary) hypertension: Secondary | ICD-10-CM | POA: Diagnosis not present

## 2021-04-09 DIAGNOSIS — Z23 Encounter for immunization: Secondary | ICD-10-CM | POA: Diagnosis not present

## 2021-04-09 DIAGNOSIS — Z1212 Encounter for screening for malignant neoplasm of rectum: Secondary | ICD-10-CM

## 2021-04-09 DIAGNOSIS — F411 Generalized anxiety disorder: Secondary | ICD-10-CM | POA: Diagnosis not present

## 2021-04-09 DIAGNOSIS — F410 Panic disorder [episodic paroxysmal anxiety] without agoraphobia: Secondary | ICD-10-CM

## 2021-04-09 DIAGNOSIS — Z1211 Encounter for screening for malignant neoplasm of colon: Secondary | ICD-10-CM

## 2021-04-09 LAB — POCT URINE DRUG SCREEN
Methylenedioxyamphetamine: NOT DETECTED
POC Amphetamine UR: NOT DETECTED
POC BENZODIAZEPINES UR: NOT DETECTED
POC Barbiturate UR: NOT DETECTED
POC Cocaine UR: NOT DETECTED
POC Ecstasy UR: NOT DETECTED
POC Marijuana UR: NOT DETECTED
POC Methadone UR: NOT DETECTED
POC Methamphetamine UR: NOT DETECTED
POC Opiate Ur: NOT DETECTED
POC Oxycodone UR: POSITIVE — AB
POC PHENCYCLIDINE UR: NOT DETECTED
POC TRICYCLICS UR: POSITIVE — AB

## 2021-04-09 MED ORDER — TETANUS-DIPHTHERIA TOXOIDS TD 5-2 LFU IM INJ: 0.5000 mL | INJECTION | Freq: Once | INTRAMUSCULAR | 0 refills | Status: AC

## 2021-04-09 MED ORDER — ALPRAZOLAM 0.25 MG PO TABS: 0.2500 mg | ORAL_TABLET | Freq: Every day | ORAL | 0 refills | Status: DC | PRN

## 2021-04-09 MED ORDER — LISINOPRIL 20 MG PO TABS
20.0000 mg | ORAL_TABLET | Freq: Every day | ORAL | 3 refills | Status: DC
Start: 2021-04-09 — End: 2021-07-29

## 2021-04-09 NOTE — Telephone Encounter (Signed)
Done during office visit today

## 2021-04-09 NOTE — Progress Notes (Signed)
[facility] [address] [place], [place] [address]  Internal MEDICINE  Office Visit Note  Patient Name: Phyllis Murphy  [id number]  [id number]  Date of Service: 04/09/2021  Chief Complaint  Patient presents with   Follow-up    Cologuard results, refills    Hypertension    HPI Vickee presents for follow up visit for medication refills, hypertension and to discuss cologard results. Her cologard results are negative.  Her blood pressure is well controlled and her anxiety is improved.  -she had the shingles vaccine x2 doses, will call or mychart message with the dates.  She denies any pain and has no other questions or concerns.    Current Medication: Outpatient Encounter Medications as of 04/09/2021  Medication Sig Note   colchicine 0.6 MG tablet Take 1 tablet (0.6 mg total) by mouth 2 (two) times daily.    cyclobenzaprine (FLEXERIL) 10 MG tablet Take 10 mg by mouth 4 (four) times daily.  04/07/2015: Received from: External Pharmacy   fexofenadine (ALLEGRA) 180 MG tablet Take 180 mg by mouth daily.    oxyCODONE-acetaminophen (PERCOCET) 7.5-325 MG tablet TAKE 1 TABLET BY MOUTH TWICE A DAY FOR TOTAL DOSE OF 7.5/5/7.5/5. TO FILL 01-15-16. 01/21/2016: Received from: External Pharmacy   Tdap (BOOSTRIX) 5-2.5-18.5 LF-MCG/0.5 injection Inject 0.5 mLs into the muscle once.    [EXPIRED] tetanus & diphtheria toxoids, adult, (TENIVAC) 5-2 LFU injection Inject 0.5 mLs into the muscle once for 1 dose.    [DISCONTINUED] ALPRAZolam (XANAX) 0.25 MG tablet Take 1 tablet (0.25 mg total) by mouth daily as needed for anxiety.    [DISCONTINUED] lisinopril (ZESTRIL) 20 MG tablet Take 1 tablet (20 mg total) by mouth daily.    ALPRAZolam (XANAX) 0.25 MG tablet Take 1 tablet (0.25 mg total) by mouth daily as needed for anxiety.    lisinopril (ZESTRIL) 20 MG tablet Take 1 tablet (20 mg total) by mouth daily.    No facility-administered encounter medications on file as of 04/09/2021.    Surgical  History: Past Surgical History:  Procedure Laterality Date   ABDOMINAL HYSTERECTOMY     AUGMENTATION MAMMAPLASTY Bilateral 2004   BREAST ENHANCEMENT SURGERY     CERVICAL FUSION     ELBOW SURGERY     two surgeries 6 yrs ago 2017    Medical History: Past Medical History:  Diagnosis Date   DDD (degenerative disc disease), cervical    History of breast augmentation    2007   Hx of hysterectomy    Hypertension     Family History: Family History  Problem Relation Age of Onset   Brain cancer Mother    Hypertension Father    Diabetes Father    Stroke Father    Breast cancer Neg Hx     Social History   Socioeconomic History   Marital status: Married    Spouse name: Not on file   Number of children: Not on file   Years of education: Not on file   Highest education level: Not on file  Occupational History   Occupation: [employer] started 2019  Tobacco Use   Smoking status: Former    Packs/day: 1.00    Years: 35.00    Pack years: 35.00    Types: Cigarettes    Start date: 08/22/1980    Quit date: 11/20/2020    Years since quitting: 0.3   Smokeless tobacco: Never  Vaping Use   Vaping Use: Every day   Devices: no nictonie  Substance and Sexual Activity  Alcohol use: No    Alcohol/week: 0.0 standard drinks   Drug use: No   Sexual activity: Yes  Other Topics Concern   Not on file  Social History Narrative   ** Merged History Encounter **       Social Determinants of Health   Financial Resource Strain: Not on file  Food Insecurity: Not on file  Transportation Needs: Not on file  Physical Activity: Not on file  Stress: Not on file  Social Connections: Not on file  Intimate Partner Violence: Not on file      Review of Systems  Constitutional:  Negative for chills, fatigue and unexpected weight change.  HENT:  Negative for congestion, rhinorrhea, sneezing and sore throat.   Eyes:  Negative for redness.  Respiratory:  Negative for cough, chest tightness and shortness  of breath.   Cardiovascular:  Negative for chest pain and palpitations.  Gastrointestinal:  Negative for abdominal pain, constipation, diarrhea, nausea and vomiting.  Genitourinary:  Negative for dysuria and frequency.  Musculoskeletal:  Negative for arthralgias, back pain, joint swelling and neck pain.  Skin:  Negative for rash.  Neurological: Negative.  Negative for tremors and numbness.  Hematological:  Negative for adenopathy. Does not bruise/bleed easily.  Psychiatric/Behavioral:  Negative for behavioral problems (Depression), sleep disturbance and suicidal ideas. The patient is not nervous/anxious.    Vital Signs: BP 106/82   Pulse (!) 110   Temp 97.8 F (36.6 C)   Resp 16   Ht 5\' 4"  (1.626 m)   Wt 115 lb 3.2 oz (52.3 kg)   SpO2 99%   BMI 19.77 kg/m    Physical Exam Vitals reviewed.  Constitutional:      General: She is not in acute distress.    Appearance: Normal appearance. She is well-developed and normal weight. She is not diaphoretic.  HENT:     Head: Normocephalic and atraumatic.  Neck:     Thyroid: No thyromegaly.     Vascular: No JVD.     Trachea: No tracheal deviation.  Cardiovascular:     Rate and Rhythm: Normal rate and regular rhythm.  Pulmonary:     Effort: Pulmonary effort is normal. No respiratory distress.     Breath sounds: No wheezing.  Skin:    General: Skin is warm and dry.     Capillary Refill: Capillary refill takes less than 2 seconds.  Neurological:     Mental Status: She is alert and oriented to person, place, and time.     Cranial Nerves: No cranial nerve deficit.  Psychiatric:        Mood and Affect: Mood normal.        Behavior: Behavior normal.     Assessment/Plan: 1. Essential hypertension Stable, lisinopril refill ordered.  - lisinopril (ZESTRIL) 20 MG tablet; Take 1 tablet (20 mg total) by mouth daily.  Dispense: 90 tablet; Refill: 3  2. Encounter for long-term (current) use of high-risk medication UDS done, was not  positive for benzos but takes them as needed. Positive for oxycodone which she gets from her pain medicine doctor.  - POCT Urine Drug Screen  3. Generalized anxiety disorder with panic attacks Stable, alprazolam refill ordered.  - ALPRAZolam (XANAX) 0.25 MG tablet; Take 1 tablet (0.25 mg total) by mouth daily as needed for anxiety.  Dispense: 30 tablet; Refill: 0  4. Need for tetanus booster Order sent to pharmacy.  - tetanus & diphtheria toxoids, adult, (TENIVAC) 5-2 LFU injection; Inject 0.5 mLs into the muscle  once for 1 dose.  Dispense: 0.5 mL; Refill: 0  5. Screening for colorectal cancer Repeat test in 3 years.    General Counseling: Lunah verbalizes understanding of the findings of todays visit and agrees with plan of treatment. I have discussed any further diagnostic evaluation that may be needed or ordered today. We also reviewed her medications today. she has been encouraged to call the office with any questions or concerns that should arise related to todays visit.    Orders Placed This Encounter  Procedures   POCT Urine Drug Screen    Meds ordered this encounter  Medications   ALPRAZolam (XANAX) 0.25 MG tablet    Sig: Take 1 tablet (0.25 mg total) by mouth daily as needed for anxiety.    Dispense:  30 tablet    Refill:  0   lisinopril (ZESTRIL) 20 MG tablet    Sig: Take 1 tablet (20 mg total) by mouth daily.    Dispense:  90 tablet    Refill:  3   tetanus & diphtheria toxoids, adult, (TENIVAC) 5-2 LFU injection    Sig: Inject 0.5 mLs into the muscle once for 1 dose.    Dispense:  0.5 mL    Refill:  0    Return in 3 months (on 07/10/2021) for F/U, anxiety med refill, Mekiyah Gladwell PCP.   Total time spent:20 Minutes Time spent includes review of chart, medications, test results, and follow up plan with the patient.   [place] Controlled Substance Database was reviewed by me.  This patient was seen by Sallyanne Kuster, FNP-C in collaboration with Dr. Beverely Risen as a part  of collaborative care agreement.   Salvator Seppala R. Tedd Sias, MSN, FNP-C Internal medicine

## 2021-04-10 ENCOUNTER — Encounter: Payer: Self-pay | Admitting: Nurse Practitioner

## 2021-04-13 ENCOUNTER — Telehealth: Payer: Self-pay

## 2021-04-13 NOTE — Telephone Encounter (Signed)
Spoke to pt, informed her cologuard was negative

## 2021-04-15 LAB — PROTIME-INR

## 2021-04-15 LAB — COLOGUARD

## 2021-04-29 ENCOUNTER — Encounter: Payer: Self-pay | Admitting: Physician Assistant

## 2021-04-29 ENCOUNTER — Other Ambulatory Visit: Payer: Self-pay

## 2021-04-29 ENCOUNTER — Ambulatory Visit (INDEPENDENT_AMBULATORY_CARE_PROVIDER_SITE_OTHER): Payer: 59 | Admitting: Physician Assistant

## 2021-04-29 DIAGNOSIS — F410 Panic disorder [episodic paroxysmal anxiety] without agoraphobia: Secondary | ICD-10-CM

## 2021-04-29 DIAGNOSIS — R42 Dizziness and giddiness: Secondary | ICD-10-CM

## 2021-04-29 DIAGNOSIS — R000 Tachycardia, unspecified: Secondary | ICD-10-CM

## 2021-04-29 DIAGNOSIS — E860 Dehydration: Secondary | ICD-10-CM

## 2021-04-29 DIAGNOSIS — I10 Essential (primary) hypertension: Secondary | ICD-10-CM | POA: Diagnosis not present

## 2021-04-29 DIAGNOSIS — F411 Generalized anxiety disorder: Secondary | ICD-10-CM | POA: Diagnosis not present

## 2021-04-29 NOTE — Progress Notes (Signed)
[facility] [address] [place], [place] [address]  Internal MEDICINE  Office Visit Note  Patient Name: Phyllis Murphy  [id number]  [id number]  Date of Service: 04/29/2021  Chief Complaint  Patient presents with   Acute Visit   Dizziness     HPI Pt is here for a sick visit. -pt went to concert on Sunday and got very warm. Had been drinking soda and was in the sun and may have gotten overheated and dehydrated -Started having some dizziness and heart was racing at times for the next few days after -Some numbness in hands as well, but this is likely attributed to her carpal tunnel and cubital ulnar compression -Today feels ok and no longer dizzy. Denies feeling like her heart is racing anymore, however her HR is normally fast in office and patient does get anxious. Discussed ordering EKG to confirm NSR and no other underlying arrhythmia contributing to symptoms despite improvement -Denies any CP or SOB -She has been rehydrating lately with water and electrolyte drinks and thinks this is helping, will update CMP and thyroid labs  Current Medication:  Outpatient Encounter Medications as of 04/29/2021  Medication Sig Note   ALPRAZolam (XANAX) 0.25 MG tablet Take 1 tablet (0.25 mg total) by mouth daily as needed for anxiety.    colchicine 0.6 MG tablet Take 1 tablet (0.6 mg total) by mouth 2 (two) times daily.    cyclobenzaprine (FLEXERIL) 10 MG tablet Take 10 mg by mouth 4 (four) times daily.  04/07/2015: Received from: External Pharmacy   fexofenadine (ALLEGRA) 180 MG tablet Take 180 mg by mouth daily.    lisinopril (ZESTRIL) 20 MG tablet Take 1 tablet (20 mg total) by mouth daily.    oxyCODONE-acetaminophen (PERCOCET) 7.5-325 MG tablet TAKE 1 TABLET BY MOUTH TWICE A DAY FOR TOTAL DOSE OF 7.5/5/7.5/5. TO FILL 01-15-16. 01/21/2016: Received from: External Pharmacy   Tdap (BOOSTRIX) 5-2.5-18.5 LF-MCG/0.5 injection Inject 0.5 mLs into the muscle once.    No  facility-administered encounter medications on file as of 04/29/2021.      Medical History: Past Medical History:  Diagnosis Date   DDD (degenerative disc disease), cervical    History of breast augmentation    2007   Hx of hysterectomy    Hypertension      Vital Signs: BP 120/78   Pulse (!) 104   Temp 97.8 F (36.6 C)   Resp 16   Ht 5\' 4"  (1.626 m)   Wt 118 lb (53.5 kg)   SpO2 99%   BMI 20.25 kg/m    Review of Systems  Constitutional:  Positive for fatigue. Negative for fever.  HENT:  Negative for congestion, mouth sores and postnasal drip.   Respiratory:  Negative for cough.   Cardiovascular:  Positive for palpitations. Negative for chest pain.  Genitourinary:  Negative for flank pain.  Neurological:  Positive for dizziness.  Psychiatric/Behavioral:  The patient is nervous/anxious.    Physical Exam Vitals and nursing note reviewed.  Constitutional:      General: She is not in acute distress.    Appearance: She is well-developed and normal weight. She is not diaphoretic.  HENT:     Head: Normocephalic and atraumatic.     Mouth/Throat:     Pharynx: No oropharyngeal exudate.  Eyes:     Pupils: Pupils are equal, round, and reactive to light.  Neck:     Thyroid: No thyromegaly.     Vascular: No JVD.     Trachea:  No tracheal deviation.  Cardiovascular:     Rate and Rhythm: Regular rhythm. Tachycardia present.     Heart sounds: Normal heart sounds. No murmur heard.   No friction rub. No gallop.  Pulmonary:     Effort: Pulmonary effort is normal. No respiratory distress.     Breath sounds: No wheezing or rales.  Chest:     Chest wall: No tenderness.  Abdominal:     General: Bowel sounds are normal.     Palpations: Abdomen is soft.  Musculoskeletal:        General: Normal range of motion.     Cervical back: Normal range of motion and neck supple.  Lymphadenopathy:     Cervical: No cervical adenopathy.  Skin:    General: Skin is warm and dry.   Neurological:     Mental Status: She is alert and oriented to person, place, and time.     Cranial Nerves: No cranial nerve deficit.  Psychiatric:        Behavior: Behavior normal.        Thought Content: Thought content normal.        Judgment: Judgment normal.      Assessment/Plan: 1. Tachycardia - EKG 12-Lead NSR with short PR  2. Dehydration Will continue to hydrate and update labs - Comprehensive metabolic panel - TSH + free T4  3. Dizziness Improved, will continue to hydrate and update labs. Will go to ED if acute worsening or new symptoms of CP or SOB arise. - Comprehensive metabolic panel - TSH + free T4  4. Essential hypertension Stable, may need to consider switching BP meds to BB if HR remains elevated  5. Generalized anxiety disorder with panic attacks May continue xanax prn, did not take xanax today and may be contributing to some increased anxiety, but did not want to take it when  feeling somewhat fatigued the last few days.   General Counseling: Phyllis Murphy verbalizes understanding of the findings of todays visit and agrees with plan of treatment. I have discussed any further diagnostic evaluation that may be needed or ordered today. We also reviewed her medications today. she has been encouraged to call the office with any questions or concerns that should arise related to todays visit.    Counseling:    Orders Placed This Encounter  Procedures   Comprehensive metabolic panel   TSH + free T4   EKG 12-Lead    No orders of the defined types were placed in this encounter.   Time spent:40 Minutes

## 2021-05-04 LAB — COMPREHENSIVE METABOLIC PANEL
ALT: 8 IU/L (ref 0–32)
AST: 12 IU/L (ref 0–40)
Albumin/Globulin Ratio: 2 (ref 1.2–2.2)
Albumin: 4.1 g/dL (ref 3.8–4.9)
Alkaline Phosphatase: 80 IU/L (ref 44–121)
BUN/Creatinine Ratio: 6 — ABNORMAL LOW (ref 9–23)
BUN: 5 mg/dL — ABNORMAL LOW (ref 6–24)
Bilirubin Total: <0.2 mg/dL (ref 0.0–1.2)
CO2: 21 mmol/L (ref 20–29)
Calcium: 9.3 mg/dL (ref 8.7–10.2)
Chloride: 99 mmol/L (ref 96–106)
Creatinine, Ser: 0.81 mg/dL (ref 0.57–1.00)
Globulin, Total: 2.1 g/dL (ref 1.5–4.5)
Glucose: 97 mg/dL (ref 65–99)
Potassium: 5.7 mmol/L — ABNORMAL HIGH (ref 3.5–5.2)
Sodium: 133 mmol/L — ABNORMAL LOW (ref 134–144)
Total Protein: 6.2 g/dL (ref 6.0–8.5)
eGFR: 87 mL/min/{1.73_m2} (ref >59)

## 2021-05-04 LAB — TSH+FREE T4
Free T4: 0.9 ng/dL (ref 0.82–1.77)
TSH: 2.06 u[IU]/mL (ref 0.450–4.500)

## 2021-05-05 ENCOUNTER — Telehealth: Payer: Self-pay

## 2021-05-05 NOTE — Telephone Encounter (Signed)
-----   Message from Carlean Jews, PA-C sent at 05/04/2021  5:15 PM EDT ----- Please call patient and let her know that her potassium levels are high and her sodium is a little low. These can indicate dehydration. She should stop any potassium supplements. Also I would have her hold her lisinopril as this can cause high potassium and monitor her BP response. If Bp rises with stopping this then she should let us know and I will have her take a lower dose or can send an alternative medication that does not raise K.

## 2021-05-05 NOTE — Telephone Encounter (Signed)
Spoke to pt and informed her of her potassium being high and sodium being low.  Pt states she doesn't take any potassium supplements.  Per Lauren advised pt to stop the lisinopril and to monitor her Bp reading and keep a log on them, and if they get to high to let us know and we will send in a lower dose or a new bp medication

## 2021-05-07 ENCOUNTER — Telehealth: Payer: Self-pay

## 2021-05-07 ENCOUNTER — Other Ambulatory Visit: Payer: Self-pay | Admitting: Physician Assistant

## 2021-05-07 DIAGNOSIS — I10 Essential (primary) hypertension: Secondary | ICD-10-CM

## 2021-05-07 MED ORDER — AMLODIPINE BESYLATE 5 MG PO TABS: 5.0000 mg | ORAL_TABLET | Freq: Every day | ORAL | 1 refills | Status: DC

## 2021-05-07 NOTE — Telephone Encounter (Signed)
error 

## 2021-06-04 ENCOUNTER — Ambulatory Visit: Payer: 59 | Admitting: Physician Assistant

## 2021-07-09 ENCOUNTER — Ambulatory Visit: Payer: Self-pay | Admitting: Nurse Practitioner

## 2021-07-10 ENCOUNTER — Emergency Department
Admission: EM | Admit: 2021-07-10 | Discharge: 2021-07-10 | Disposition: A | Discharge status: Home / Self Care | Payer: Medicare Other | Attending: Emergency Medicine | Admitting: Emergency Medicine

## 2021-07-10 ENCOUNTER — Other Ambulatory Visit: Payer: Self-pay

## 2021-07-10 DIAGNOSIS — Z79899 Other long term (current) drug therapy: Secondary | ICD-10-CM | POA: Insufficient documentation

## 2021-07-10 DIAGNOSIS — Z87891 Personal history of nicotine dependence: Secondary | ICD-10-CM | POA: Diagnosis not present

## 2021-07-10 DIAGNOSIS — R21 Rash and other nonspecific skin eruption: Secondary | ICD-10-CM

## 2021-07-10 DIAGNOSIS — I10 Essential (primary) hypertension: Secondary | ICD-10-CM | POA: Diagnosis not present

## 2021-07-10 DIAGNOSIS — Z91040 Latex allergy status: Secondary | ICD-10-CM | POA: Insufficient documentation

## 2021-07-10 MED ORDER — TRIAMCINOLONE ACETONIDE 0.1 % EX CREA: 1.0000 "application " | TOPICAL_CREAM | Freq: Two times a day (BID) | CUTANEOUS | 0 refills | Status: DC

## 2021-07-10 MED ORDER — HYDROXYZINE HCL 25 MG PO TABS: 25.0000 mg | ORAL_TABLET | Freq: Four times a day (QID) | ORAL | 0 refills | Status: DC | PRN

## 2021-07-10 NOTE — ED Notes (Signed)
See triage note. Pt ambulatory to room. Pt stating she had a mite infestation and called the terminator. Pt has bag with mites with her. Pt in NAD.

## 2021-07-10 NOTE — ED Provider Notes (Signed)
[facility] Emergency Department Provider Note  ____________________________________________   Event Date/Time   First MD Initiated Contact with Patient 07/10/21 1211     (approximate)  I have reviewed the triage vital signs and the nursing notes.   HISTORY  Chief Complaint No chief complaint on file.   HPI Phyllis Murphy is a 55 y.o. female presents to the ED with complaint of "mites biting" her.  Patient states that she is already had an exterminator come to her house.  She states that there is a dog in the house but does not have fleas.  She is the only one in the home that is being affected by this.  She reports that her husband does not have any bites.  Currently she denies any pain.         Past Medical History:  Diagnosis Date   DDD (degenerative disc disease), cervical    History of breast augmentation    2007   Hx of hysterectomy    Hypertension     Patient Active Problem List   Diagnosis Date Noted   Generalized anxiety disorder with panic attacks 02/06/2021   Idiopathic gout 11/21/2018   Drug-induced constipation 11/14/2018   Ganglion cyst of dorsum of left wrist 06/20/2018   Mixed incontinence urge and stress 06/20/2018   Tinea versicolor 06/20/2018   Mixed hyperlipidemia 07/13/2015   Tobacco abuse 07/13/2015   Cervical spinal stenosis 04/04/2012   Essential hypertension 04/04/2012   Back ache 02/03/2012   Hand numbness 02/03/2012    Past Surgical History:  Procedure Laterality Date   ABDOMINAL HYSTERECTOMY     AUGMENTATION MAMMAPLASTY Bilateral 2004   BREAST ENHANCEMENT SURGERY     CERVICAL FUSION     ELBOW SURGERY     two surgeries 6 yrs ago 2017    Prior to Admission medications   Medication Sig Start Date End Date Taking? Authorizing Provider  hydrOXYzine (ATARAX/VISTARIL) 25 MG tablet Take 1 tablet (25 mg total) by mouth every 6 (six) hours as needed for itching. 07/10/21  Yes Bridget Hartshorn L, PA-C   triamcinolone cream (KENALOG) 0.1 % Apply 1 application topically 2 (two) times daily. Apply a thin layer to areas bid 07/10/21  Yes Bridget Hartshorn L, PA-C  ALPRAZolam Prudy Feeler) 0.25 MG tablet Take 1 tablet (0.25 mg total) by mouth daily as needed for anxiety. 04/09/21   Sallyanne Kuster, NP  amLODipine (NORVASC) 5 MG tablet Take 1 tablet (5 mg total) by mouth daily. 05/07/21   McDonough, Salomon Fick, PA-C  colchicine 0.6 MG tablet Take 1 tablet (0.6 mg total) by mouth 2 (two) times daily. 12/03/18   Reubin Milan, MD  cyclobenzaprine (FLEXERIL) 10 MG tablet Take 10 mg by mouth 4 (four) times daily.  03/05/15   [provider]  fexofenadine (ALLEGRA) 180 MG tablet Take 180 mg by mouth daily.    [provider]  lisinopril (ZESTRIL) 20 MG tablet Take 1 tablet (20 mg total) by mouth daily. 04/09/21   Sallyanne Kuster, NP  oxyCODONE-acetaminophen (PERCOCET) 7.5-325 MG tablet TAKE 1 TABLET BY MOUTH TWICE A DAY FOR TOTAL DOSE OF 7.5/5/7.5/5. TO FILL 01-15-16. 01/15/16   [provider]  Tdap (BOOSTRIX) 5-2.5-18.5 LF-MCG/0.5 injection Inject 0.5 mLs into the muscle once.    [provider]    Allergies Esmolol, Latex, Bacitracin, Other, and Sulfa antibiotics  Family History  Problem Relation Age of Onset   Brain cancer Mother    Hypertension Father  Diabetes Father    Stroke Father    Breast cancer Neg Hx     Social History Social History   Tobacco Use   Smoking status: Former    Packs/day: 1.00    Years: 35.00    Pack years: 35.00    Types: Cigarettes    Start date: 08/22/1980    Quit date: 11/20/2020    Years since quitting: 0.6   Smokeless tobacco: Never  Vaping Use   Vaping Use: Every day   Devices: no nictonie  Substance Use Topics   Alcohol use: No    Alcohol/week: 0.0 standard drinks   Drug use: No    Review of Systems Constitutional: No fever/chills Eyes: No visual changes. Cardiovascular: Denies chest pain. Respiratory: Denies  shortness of breath. Gastrointestinal: No abdominal pain.  No nausea, no vomiting.   Musculoskeletal: Negative for musculoskeletal pain. Skin: Positive for insect bite. Neurological: Negative for headaches, focal weakness or numbness. ____________________________________________   PHYSICAL EXAM:  VITAL SIGNS: ED Triage Vitals  Enc Vitals Group     BP 07/10/21 0923 (!) 116/97     Pulse Rate 07/10/21 0923 78     Resp 07/10/21 0923 20     Temp 07/10/21 0923 98.2 F (36.8 C)     Temp Source 07/10/21 0923 Oral     SpO2 07/10/21 0923 97 %     Weight 07/10/21 0924 120 lb (54.4 kg)     Height 07/10/21 0924 5\' 4"  (1.626 m)     Head Circumference --      Peak Flow --      Pain Score 07/10/21 0924 0     Pain Loc --      Pain Edu? --      Excl. in GC? --     Constitutional: Alert and oriented. Well appearing and in no acute distress. Eyes: Conjunctivae are normal.  Head: Atraumatic. Neck: No stridor.   Cardiovascular: Normal rate, regular rhythm. Grossly normal heart sounds.  Good peripheral circulation. Respiratory: Normal respiratory effort.  No retractions. Lungs CTAB. Gastrointestinal: Soft and nontender. No distention. Musculoskeletal: Moves upper and lower extremities without any difficulty.  Normal gait was noted. Neurologic:  Normal speech and language. No gross focal neurologic deficits are appreciated. No gait instability. Skin:  Skin is warm, dry and intact.  There are individual random papules on extremities and trunk suggestive of possible insect bite.  These do not appear to be infected or draining.  Nontender to palpation. Psychiatric: Mood and affect are normal. Speech and behavior are normal.  ____________________________________________   LABS (all labs ordered are listed, but only abnormal results are displayed)  Labs Reviewed - No data to display _ PROCEDURES  Procedure(s) performed (including Critical  Care):  Procedures   ____________________________________________   INITIAL IMPRESSION / ASSESSMENT AND PLAN / ED COURSE  As part of my medical decision making, I reviewed the following data within the electronic MEDICAL RECORD NUMBER Notes from prior ED visits  55 year old female presents to the ED with complaint of bites due to mites.  Patient reports that she had an exterminator in her home.  Currently she is the only 1 being affected by this.  There is a dog in the home but has been found not to have fleas.  Patient states the areas itch a great deal.  A prescription for hydroxyzine 25 mg 1 every 6 hours as needed for itching and triamcinolone cream twice daily to the areas as needed for itching.  Patient  is to follow-up with [facility] or [place] [facility] if any continued problems.   ____________________________________________   FINAL CLINICAL IMPRESSION(S) / ED DIAGNOSES  Final diagnoses:  Rash and nonspecific skin eruption     ED Discharge Orders          Ordered    hydrOXYzine (ATARAX/VISTARIL) 25 MG tablet  Every 6 hours PRN        07/10/21 1250    triamcinolone cream (KENALOG) 0.1 %  2 times daily        07/10/21 1250             Note:  This document was prepared using Dragon voice recognition software and may include unintentional dictation errors.    Tommi Rumps, PA-C 07/10/21 1438    Sharman Cheek, MD 07/12/21 [phone]

## 2021-07-10 NOTE — Discharge Instructions (Signed)
You may follow-up with your primary care provider,  [facility] or [facility].  The dermatology offices are listed on your discharge papers along with their phone number if you need to make an appointment.  2 medications were sent to your pharmacy.  1 is a pill that is taken every 6 hours as needed for itching.  The cream is to apply to individual areas twice a day.  This medicine should be used in a very thin layer in these areas.

## 2021-07-10 NOTE — ED Triage Notes (Signed)
Pt to ED for mites biting her. States had exterminator come to house.  Brought ziploc bags with "mites" in them  Pt tearful in triage, stating she needs help

## 2021-07-10 NOTE — ED Notes (Signed)
Dc ppw provided. Followup information and rx info provided. Pt denies questions. Pt verbal consent for dc given. Pt assisted off unit on foot

## 2021-07-16 ENCOUNTER — Emergency Department: Admission: EM | Admit: 2021-07-16 | Discharge: 2021-07-16 | Discharge status: Against Medical Advice | Payer: Self-pay

## 2021-07-16 NOTE — ED Triage Notes (Signed)
Pt called from [facility] to triage room, no response

## 2021-07-16 NOTE — ED Triage Notes (Signed)
Pt called x's 3, no response ?

## 2021-07-16 NOTE — ED Triage Notes (Signed)
Pt called for triage, no response. 

## 2021-07-23 ENCOUNTER — Other Ambulatory Visit: Payer: Self-pay | Admitting: Physician Assistant

## 2021-07-23 DIAGNOSIS — I10 Essential (primary) hypertension: Secondary | ICD-10-CM

## 2021-07-29 ENCOUNTER — Ambulatory Visit (INDEPENDENT_AMBULATORY_CARE_PROVIDER_SITE_OTHER): Payer: PPO | Admitting: Nurse Practitioner

## 2021-07-29 ENCOUNTER — Other Ambulatory Visit: Payer: Self-pay

## 2021-07-29 ENCOUNTER — Encounter: Payer: Self-pay | Admitting: Nurse Practitioner

## 2021-07-29 VITALS — BP 130/79 | HR 100 | Temp 98.5°F | Resp 16 | Ht 64.0 in | Wt 114.2 lb

## 2021-07-29 DIAGNOSIS — F411 Generalized anxiety disorder: Secondary | ICD-10-CM

## 2021-07-29 DIAGNOSIS — B86 Scabies: Secondary | ICD-10-CM | POA: Diagnosis not present

## 2021-07-29 DIAGNOSIS — F410 Panic disorder [episodic paroxysmal anxiety] without agoraphobia: Secondary | ICD-10-CM | POA: Diagnosis not present

## 2021-07-29 MED ORDER — IVERMECTIN 3 MG PO TABS: 200.0000 ug/kg | ORAL_TABLET | ORAL | 0 refills | Status: AC

## 2021-07-29 MED ORDER — PERMETHRIN 5 % EX CREA: 1.0000 "application " | TOPICAL_CREAM | Freq: Once | CUTANEOUS | 1 refills | Status: AC

## 2021-07-29 MED ORDER — ALPRAZOLAM 0.25 MG PO TABS: 0.2500 mg | ORAL_TABLET | Freq: Every day | ORAL | 0 refills | Status: DC | PRN

## 2021-07-29 NOTE — Progress Notes (Signed)
[facility] [address] [place], [place] [address]  Internal MEDICINE  Office Visit Note  Patient Name: Phyllis Murphy  [id number]  [id number]  Date of Service: 07/29/2021  Chief Complaint  Patient presents with   Acute Visit    Itching all over, feels like something is biting her, started about 3 months ago     HPI Phyllis presents for an acute sick visit for extreme itching all over. She reports feeling like something is biting. She states that this start 3 months ago and has just kept getting worse.  She also reports that when she lies still, she can hear them crawling. OTC permethrin tried.    Current Medication:  Outpatient Encounter Medications as of 07/29/2021  Medication Sig Note   amLODipine (NORVASC) 5 MG tablet TAKE 1 TABLET BY MOUTH ONCE DAILY    colchicine 0.6 MG tablet Take 1 tablet (0.6 mg total) by mouth 2 (two) times daily.    cyclobenzaprine (FLEXERIL) 10 MG tablet Take 10 mg by mouth 4 (four) times daily.  04/07/2015: Received from: External Pharmacy   fexofenadine (ALLEGRA) 180 MG tablet Take 180 mg by mouth daily.    [EXPIRED] ivermectin (STROMECTOL) 3 MG TABS tablet Take 3.5 tablets (10,500 mcg total) by mouth once a week for 2 doses.    oxyCODONE-acetaminophen (PERCOCET) 7.5-325 MG tablet TAKE 1 TABLET BY MOUTH TWICE A DAY FOR TOTAL DOSE OF 7.5/5/7.5/5. TO FILL 01-15-16. 01/21/2016: Received from: External Pharmacy   [EXPIRED] permethrin (ELIMITE) 5 % cream Apply 1 application topically once for 1 dose. At bedtime, wash off the next morning. May pick 2nd fill for 1 additional dose after 7-14 days if still symptomatic.    Tdap (BOOSTRIX) 5-2.5-18.5 LF-MCG/0.5 injection Inject 0.5 mLs into the muscle once.    triamcinolone cream (KENALOG) 0.1 % Apply 1 application topically 2 (two) times daily. Apply a thin layer to areas bid    [DISCONTINUED] ALPRAZolam (XANAX) 0.25 MG tablet Take 1 tablet (0.25 mg total) by mouth daily as needed for anxiety.     [DISCONTINUED] ALPRAZolam (XANAX) 0.25 MG tablet Take 1 tablet (0.25 mg total) by mouth daily as needed for anxiety.    [DISCONTINUED] hydrOXYzine (ATARAX/VISTARIL) 25 MG tablet Take 1 tablet (25 mg total) by mouth every 6 (six) hours as needed for itching. (Patient not taking: Reported on 07/29/2021)    [DISCONTINUED] lisinopril (ZESTRIL) 20 MG tablet Take 1 tablet (20 mg total) by mouth daily. (Patient not taking: Reported on 07/29/2021)    No facility-administered encounter medications on file as of 07/29/2021.      Medical History: Past Medical History:  Diagnosis Date   DDD (degenerative disc disease), cervical    History of breast augmentation    2007   Hx of hysterectomy    Hypertension      Vital Signs: BP 130/79   Pulse 100   Temp 98.5 F (36.9 C)   Resp 16   Ht 5\' 4"  (1.626 m)   Wt 114 lb 3.2 oz (51.8 kg)   SpO2 99%   BMI 19.60 kg/m    Review of Systems  Constitutional:  Negative for chills, fatigue and unexpected weight change.  HENT:  Negative for congestion, rhinorrhea, sneezing and sore throat.   Eyes:  Negative for redness.  Respiratory:  Negative for cough, chest tightness and shortness of breath.   Cardiovascular:  Negative for chest pain and palpitations.  Gastrointestinal:  Negative for abdominal pain, constipation, diarrhea, nausea and vomiting.  Genitourinary:  Negative for dysuria and frequency.  Musculoskeletal:  Negative for arthralgias, back pain, joint swelling and neck pain.  Skin:  Positive for rash (scattered bug bites and scaling).       Itching  Neurological: Negative.  Negative for tremors and numbness.  Hematological:  Negative for adenopathy. Does not bruise/bleed easily.  Psychiatric/Behavioral:  Positive for sleep disturbance (due to itching). Negative for behavioral problems (Depression), self-injury and suicidal ideas. The patient is nervous/anxious.    Physical Exam Vitals reviewed.  Constitutional:      General: She is not in  acute distress.    Appearance: Normal appearance. She is normal weight. She is not ill-appearing.  HENT:     Head: Normocephalic and atraumatic.     Right Ear: Tympanic membrane, ear canal and external ear normal.     Left Ear: Tympanic membrane, ear canal and external ear normal.  Eyes:     Pupils: Pupils are equal, round, and reactive to light.  Cardiovascular:     Rate and Rhythm: Normal rate and regular rhythm.  Pulmonary:     Effort: Pulmonary effort is normal. No respiratory distress.  Skin:    Findings: Rash present. Rash is crusting and scaling.     Comments: Small scattered crusted open areas - most significant areas are hands arms, feet and lower legs. Some small reddened areas that look like bug bites with crusting and scaling and linear marks that resemble burrowing scabies.   Neurological:     Mental Status: She is alert and oriented to person, place, and time.     Cranial Nerves: No cranial nerve deficit.     Coordination: Coordination normal.     Gait: Gait normal.      Assessment/Plan: 1. Scabies Ivermectin prescribed along with 1 time dose of topical permethrin.  - ivermectin (STROMECTOL) 3 MG TABS tablet; Take 3.5 tablets (10,500 mcg total) by mouth once a week for 2 doses.  Dispense: 8 tablet; Refill: 0 - permethrin (ELIMITE) 5 % cream; Apply 1 application topically once for 1 dose. At bedtime, wash off the next morning. May pick 2nd fill for 1 additional dose after 7-14 days if still symptomatic.  Dispense: 60 g; Refill: 1  2. Generalized anxiety disorder with panic attacks Alprazolam refill ordered.      General Counseling: Gary verbalizes understanding of the findings of todays visit and agrees with plan of treatment. I have discussed any further diagnostic evaluation that may be needed or ordered today. We also reviewed her medications today. she has been encouraged to call the office with any questions or concerns that should arise related to todays  visit.    Counseling:    No orders of the defined types were placed in this encounter.   Meds ordered this encounter  Medications   ivermectin (STROMECTOL) 3 MG TABS tablet    Sig: Take 3.5 tablets (10,500 mcg total) by mouth once a week for 2 doses.    Dispense:  8 tablet    Refill:  0   permethrin (ELIMITE) 5 % cream    Sig: Apply 1 application topically once for 1 dose. At bedtime, wash off the next morning. May pick 2nd fill for 1 additional dose after 7-14 days if still symptomatic.    Dispense:  60 g    Refill:  1   DISCONTD: ALPRAZolam (XANAX) 0.25 MG tablet    Sig: Take 1 tablet (0.25 mg total) by mouth daily as needed for anxiety.    Dispense:  30 tablet    Refill:  0    Return if symptoms worsen or fail to improve.  [place] Controlled Substance Database was reviewed by me for overdose risk score (ORS)  Time spent:20 Minutes Time spent with patient included reviewing progress notes, labs, imaging studies, and discussing plan for follow up.   This patient was seen by Sallyanne Kuster, FNP-C in collaboration with Dr. Beverely Risen as a part of collaborative care agreement.  Elenora Hawbaker R. Tedd Sias, MSN, FNP-C Internal Medicine

## 2021-08-02 ENCOUNTER — Other Ambulatory Visit: Payer: Self-pay

## 2021-08-02 ENCOUNTER — Encounter: Payer: Self-pay | Admitting: Nurse Practitioner

## 2021-08-02 ENCOUNTER — Emergency Department
Admission: EM | Admit: 2021-08-02 | Discharge: 2021-08-02 | Disposition: A | Discharge status: Home / Self Care | Payer: PPO | Attending: Emergency Medicine | Admitting: Emergency Medicine

## 2021-08-02 DIAGNOSIS — Z79899 Other long term (current) drug therapy: Secondary | ICD-10-CM | POA: Insufficient documentation

## 2021-08-02 DIAGNOSIS — F22 Delusional disorders: Secondary | ICD-10-CM | POA: Insufficient documentation

## 2021-08-02 DIAGNOSIS — Z91040 Latex allergy status: Secondary | ICD-10-CM | POA: Diagnosis not present

## 2021-08-02 DIAGNOSIS — Z046 Encounter for general psychiatric examination, requested by authority: Secondary | ICD-10-CM | POA: Diagnosis present

## 2021-08-02 DIAGNOSIS — Z87891 Personal history of nicotine dependence: Secondary | ICD-10-CM | POA: Diagnosis not present

## 2021-08-02 DIAGNOSIS — I10 Essential (primary) hypertension: Secondary | ICD-10-CM | POA: Diagnosis not present

## 2021-08-02 NOTE — ED Triage Notes (Signed)
Pt states she has worms in the back of her throat that are "clicking" and maggots coming out of her head. Pt states has been itching. Pt states she was prescribed ivermectin on Friday.

## 2021-08-02 NOTE — ED Provider Notes (Signed)
[facility]  ____________________________________________   Event Date/Time   First MD Initiated Contact with Patient 08/02/21 0451     (approximate)  I have reviewed the triage vital signs and the nursing notes.   HISTORY  Chief Complaint "bugs in my head"    HPI Phyllis Murphy is a 55 y.o. female with past medical history of hypertension who presents with concern for bugs in her throat and on her head.  Patient tells me this has been going on for quite a while she has seen multiple providers about it and has been told that there are no bugs.  She was recently prescribed ivermectin and has had permethrin before as well.  She is concerned that there are maggots in her throat and coming out of her nose and on her head.  Patient presents with a bag of skin which she believes are bugs.  Has never seen a therapist or psychiatrist for this before.         Past Medical History:  Diagnosis Date   DDD (degenerative disc disease), cervical    History of breast augmentation    2007   Hx of hysterectomy    Hypertension     Patient Active Problem List   Diagnosis Date Noted   Generalized anxiety disorder with panic attacks 02/06/2021   Idiopathic gout 11/21/2018   Drug-induced constipation 11/14/2018   Ganglion cyst of dorsum of left wrist 06/20/2018   Mixed incontinence urge and stress 06/20/2018   Tinea versicolor 06/20/2018   Mixed hyperlipidemia 07/13/2015   Tobacco abuse 07/13/2015   Cervical spinal stenosis 04/04/2012   Essential hypertension 04/04/2012   Back ache 02/03/2012   Hand numbness 02/03/2012    Past Surgical History:  Procedure Laterality Date   ABDOMINAL HYSTERECTOMY     AUGMENTATION MAMMAPLASTY Bilateral 2004   BREAST ENHANCEMENT SURGERY     CERVICAL FUSION     ELBOW SURGERY     two surgeries 6 yrs ago 2017    Prior to Admission medications   Medication Sig Start Date End Date Taking? Authorizing Provider  ALPRAZolam  (XANAX) 0.25 MG tablet Take 1 tablet (0.25 mg total) by mouth daily as needed for anxiety. 07/29/21   Sallyanne Kuster, NP  amLODipine (NORVASC) 5 MG tablet TAKE 1 TABLET BY MOUTH ONCE DAILY 07/23/21   McDonough, Salomon Fick, PA-C  colchicine 0.6 MG tablet Take 1 tablet (0.6 mg total) by mouth 2 (two) times daily. 12/03/18   Reubin Milan, MD  cyclobenzaprine (FLEXERIL) 10 MG tablet Take 10 mg by mouth 4 (four) times daily.  03/05/15   [provider]  fexofenadine (ALLEGRA) 180 MG tablet Take 180 mg by mouth daily.    [provider]  ivermectin (STROMECTOL) 3 MG TABS tablet Take 3.5 tablets (10,500 mcg total) by mouth once a week for 2 doses. 07/29/21 08/06/21  Sallyanne Kuster, NP  oxyCODONE-acetaminophen (PERCOCET) 7.5-325 MG tablet TAKE 1 TABLET BY MOUTH TWICE A DAY FOR TOTAL DOSE OF 7.5/5/7.5/5. TO FILL 01-15-16. 01/15/16   [provider]  Tdap (BOOSTRIX) 5-2.5-18.5 LF-MCG/0.5 injection Inject 0.5 mLs into the muscle once.    [provider]  triamcinolone cream (KENALOG) 0.1 % Apply 1 application topically 2 (two) times daily. Apply a thin layer to areas bid 07/10/21   Tommi Rumps, PA-C    Allergies Esmolol, Latex, Bacitracin, Other, and Sulfa antibiotics  Family History  Problem Relation Age of Onset   Brain cancer Mother  Hypertension Father    Diabetes Father    Stroke Father    Breast cancer Neg Hx     Social History Social History   Tobacco Use   Smoking status: Former    Packs/day: 1.00    Years: 35.00    Pack years: 35.00    Types: Cigarettes    Start date: 08/22/1980    Quit date: 11/20/2020    Years since quitting: 0.6   Smokeless tobacco: Never  Vaping Use   Vaping Use: Every day   Devices: no nictonie  Substance Use Topics   Alcohol use: No    Alcohol/week: 0.0 standard drinks   Drug use: No    Review of Systems   Review of Systems  Skin:  Positive for rash.  All other systems reviewed and are negative.  Physical  Exam Updated Vital Signs BP (!) 145/105   Pulse 98   Temp 97.8 F (36.6 C)   Resp 16   Ht 5\' 4"  (1.626 m)   Wt 51.7 kg   SpO2 100%   BMI 19.57 kg/m   Physical Exam Vitals and nursing note reviewed.  Constitutional:      General: She is not in acute distress.    Appearance: Normal appearance.  HENT:     Head: Normocephalic and atraumatic.     Mouth/Throat:     Pharynx: Oropharynx is clear.  Eyes:     General: No scleral icterus.    Conjunctiva/sclera: Conjunctivae normal.  Pulmonary:     Effort: Pulmonary effort is normal. No respiratory distress.     Breath sounds: No stridor.  Musculoskeletal:        General: No deformity or signs of injury.     Cervical back: Normal range of motion.  Skin:    General: Skin is dry.     Coloration: Skin is not jaundiced or pale.     Comments: Excoriations on the right side of the neck, no visible bugs or lesions in the scalp  Neurological:     General: No focal deficit present.     Mental Status: She is alert and oriented to person, place, and time. Mental status is at baseline.  Psychiatric:        Mood and Affect: Mood normal.        Behavior: Behavior normal.     LABS (all labs ordered are listed, but only abnormal results are displayed)  Labs Reviewed - No data to display ____________________________________________  EKG  N/a ____________________________________________  RADIOLOGY , personally viewed and evaluated these images (plain radiographs) as part of my medical decision making, as well as reviewing the written report by the radiologist.  ED MD interpretation:  n/a    ____________________________________________   PROCEDURES  Procedure(s) performed (including Critical Care):  Procedures   ____________________________________________   INITIAL IMPRESSION / ASSESSMENT AND PLAN / ED COURSE     Patient is a 55 year old female presenting with delusions of parasitosis.  She is  adamant that there are maggots on her scalp and in her nose and throat.  She presents with a bag of skin which she thinks are bugs and has an empty grocery bag that she believes has bugs in it as well.  Discussed with her that I do not appreciate any bugs on her body and recommended that she potentially discuss this with a psychiatrist or therapist but she was very resistant to this and ultimately left without being registered or getting her discharge papers.  ____________________________________________   FINAL CLINICAL IMPRESSION(S) / ED DIAGNOSES  Final diagnoses:  Delusions of parasitosis [facility])     ED Discharge Orders     None        Note:  This document was prepared using Dragon voice recognition software and may include unintentional dictation errors.    Georga Hacking, MD 08/02/21 [phone]

## 2021-08-02 NOTE — ED Notes (Signed)
Pt left immediately after MD exam, pt would not stop for registration or dischargre instruction.

## 2021-08-04 DIAGNOSIS — M545 Low back pain, unspecified: Secondary | ICD-10-CM | POA: Diagnosis not present

## 2021-08-04 DIAGNOSIS — G5603 Carpal tunnel syndrome, bilateral upper limbs: Secondary | ICD-10-CM | POA: Diagnosis not present

## 2021-08-04 DIAGNOSIS — M5416 Radiculopathy, lumbar region: Secondary | ICD-10-CM | POA: Diagnosis not present

## 2021-08-04 DIAGNOSIS — M5412 Radiculopathy, cervical region: Secondary | ICD-10-CM | POA: Diagnosis not present

## 2021-08-04 DIAGNOSIS — Z79899 Other long term (current) drug therapy: Secondary | ICD-10-CM | POA: Diagnosis not present

## 2021-08-04 DIAGNOSIS — G894 Chronic pain syndrome: Secondary | ICD-10-CM | POA: Diagnosis not present

## 2021-08-12 DIAGNOSIS — G5603 Carpal tunnel syndrome, bilateral upper limbs: Secondary | ICD-10-CM | POA: Diagnosis not present

## 2021-08-18 ENCOUNTER — Other Ambulatory Visit: Payer: Self-pay | Admitting: Nurse Practitioner

## 2021-08-18 DIAGNOSIS — F410 Panic disorder [episodic paroxysmal anxiety] without agoraphobia: Secondary | ICD-10-CM

## 2021-08-19 MED ORDER — ALPRAZOLAM 0.25 MG PO TABS: 0.2500 mg | ORAL_TABLET | Freq: Every day | ORAL | 0 refills | Status: DC | PRN

## 2021-08-19 NOTE — Telephone Encounter (Signed)
Med sent to pharmacy.

## 2021-08-26 ENCOUNTER — Other Ambulatory Visit: Payer: Self-pay | Admitting: Nurse Practitioner

## 2021-08-26 DIAGNOSIS — F410 Panic disorder [episodic paroxysmal anxiety] without agoraphobia: Secondary | ICD-10-CM

## 2021-08-26 NOTE — Telephone Encounter (Signed)
Please see this

## 2021-08-29 ENCOUNTER — Encounter: Payer: Self-pay | Admitting: Nurse Practitioner

## 2021-09-03 ENCOUNTER — Encounter: Payer: Self-pay | Admitting: Nurse Practitioner

## 2021-09-03 ENCOUNTER — Other Ambulatory Visit: Payer: Self-pay

## 2021-09-03 ENCOUNTER — Ambulatory Visit (INDEPENDENT_AMBULATORY_CARE_PROVIDER_SITE_OTHER): Payer: PPO | Admitting: Nurse Practitioner

## 2021-09-03 VITALS — BP 160/90 | HR 97 | Temp 98.7°F | Resp 16 | Ht 64.0 in | Wt 113.0 lb

## 2021-09-03 DIAGNOSIS — F410 Panic disorder [episodic paroxysmal anxiety] without agoraphobia: Secondary | ICD-10-CM

## 2021-09-03 DIAGNOSIS — L299 Pruritus, unspecified: Secondary | ICD-10-CM

## 2021-09-03 DIAGNOSIS — F411 Generalized anxiety disorder: Secondary | ICD-10-CM

## 2021-09-03 DIAGNOSIS — F458 Other somatoform disorders: Secondary | ICD-10-CM | POA: Diagnosis not present

## 2021-09-03 MED ORDER — ALPRAZOLAM 0.25 MG PO TABS: 0.2500 mg | ORAL_TABLET | Freq: Three times a day (TID) | ORAL | 0 refills | Status: DC | PRN

## 2021-09-03 MED ORDER — MIRTAZAPINE 15 MG PO TABS: 15.0000 mg | ORAL_TABLET | Freq: Every day | ORAL | 1 refills | Status: DC

## 2021-09-03 NOTE — Progress Notes (Signed)
[facility] [address] [place], [place] [address]  Internal MEDICINE  Office Visit Note  Patient Name: Phyllis Murphy  [id number] 26, 2067  [id number]  Date of Service: 09/03/2021  Chief Complaint  Patient presents with   Acute Visit    Itching all over     HPI Phyllis Murphy presents for an acute sick visit for itching all over. She still feels like she is itchy all over and is hearing a buzzing sound in her left ear. She cannot think about anything else and her anxiety level has been high. She was previously treated for scabies and has been using OTC permethrin shampoo and soap trying to get the itch to stop. The itching is significantly worse at night. She is not convinced herself that there are bugs anymore and thinks it could just be her anxiety.     Current Medication:  Outpatient Encounter Medications as of 09/03/2021  Medication Sig Note   amLODipine (NORVASC) 5 MG tablet TAKE 1 TABLET BY MOUTH ONCE DAILY    colchicine 0.6 MG tablet Take 1 tablet (0.6 mg total) by mouth 2 (two) times daily.    cyclobenzaprine (FLEXERIL) 10 MG tablet Take 10 mg by mouth 4 (four) times daily.  04/07/2015: Received from: External Pharmacy   doxepin (SINEQUAN) 10 MG capsule Take 10 mg by mouth at bedtime.    fexofenadine (ALLEGRA) 180 MG tablet Take 180 mg by mouth daily.    mirtazapine (REMERON) 15 MG tablet Take 1 tablet (15 mg total) by mouth at bedtime.    oxyCODONE-acetaminophen (PERCOCET) 7.5-325 MG tablet TAKE 1 TABLET BY MOUTH TWICE A DAY FOR TOTAL DOSE OF 7.5/5/7.5/5. TO FILL 01-15-16. 01/21/2016: Received from: External Pharmacy   Tdap (BOOSTRIX) 5-2.5-18.5 LF-MCG/0.5 injection Inject 0.5 mLs into the muscle once.    triamcinolone cream (KENALOG) 0.1 % Apply 1 application topically 2 (two) times daily. Apply a thin layer to areas bid    [DISCONTINUED] ALPRAZolam (XANAX) 0.25 MG tablet Take 1 tablet (0.25 mg total) by mouth daily as needed for anxiety.    ALPRAZolam (XANAX) 0.25 MG tablet  Take 1 tablet (0.25 mg total) by mouth 3 (three) times daily as needed for anxiety or sleep.    No facility-administered encounter medications on file as of 09/03/2021.      Medical History: Past Medical History:  Diagnosis Date   DDD (degenerative disc disease), cervical    History of breast augmentation    2007   Hx of hysterectomy    Hypertension      Vital Signs: BP (!) 160/90    Pulse 97    Temp 98.7 F (37.1 C)    Resp 16    Ht 5\' 4"  (1.626 m)    Wt 113 lb (51.3 kg)    SpO2 99%    BMI 19.40 kg/m    Review of Systems  Constitutional:  Negative for chills, fatigue and unexpected weight change.  HENT:  Negative for congestion, rhinorrhea, sneezing and sore throat.   Eyes:  Negative for redness.  Respiratory:  Negative for cough, chest tightness and shortness of breath.   Cardiovascular:  Negative for chest pain and palpitations.  Gastrointestinal:  Negative for abdominal pain, constipation, diarrhea, nausea and vomiting.  Genitourinary:  Negative for dysuria and frequency.  Musculoskeletal:  Negative for arthralgias, back pain, joint swelling and neck pain.  Skin:  Negative for rash.       Itching  Neurological: Negative.  Negative for tremors and numbness.  Hematological:  Negative for adenopathy. Does not bruise/bleed easily.  Psychiatric/Behavioral:  Positive for sleep disturbance (due to itching). Negative for behavioral problems (Depression), self-injury and suicidal ideas. The patient is nervous/anxious.    Physical Exam Vitals reviewed.  Constitutional:      General: She is not in acute distress.    Appearance: Normal appearance. She is normal weight. She is not ill-appearing.  HENT:     Head: Normocephalic and atraumatic.     Right Ear: Tympanic membrane, ear canal and external ear normal.     Left Ear: Tympanic membrane, ear canal and external ear normal.  Eyes:     Pupils: Pupils are equal, round, and reactive to light.  Cardiovascular:     Rate and  Rhythm: Normal rate and regular rhythm.  Pulmonary:     Effort: Pulmonary effort is normal. No respiratory distress.  Skin:    Findings: No rash.  Neurological:     Mental Status: She is alert and oriented to person, place, and time.     Cranial Nerves: No cranial nerve deficit.     Coordination: Coordination normal.     Gait: Gait normal.  Psychiatric:        Mood and Affect: Mood is anxious. Affect is tearful.        Behavior: Behavior is agitated and hyperactive. Behavior is cooperative.        Thought Content: Thought content is paranoid.      Assessment/Plan: 1. Itching May use benadryl for itching as needed.   2. Psychogenic pruritus Will try mirtazapine to help decreased nocturnal pruritus, help her sleep better and may also help with weight gain, patient is borderline underweight. Follow up in 3 weeks.  - mirtazapine (REMERON) 15 MG tablet; Take 1 tablet (15 mg total) by mouth at bedtime.  Dispense: 30 tablet; Refill: 1  3. Generalized anxiety disorder with panic attacks Anxiety is increased, frequency of alprazolam adjusted and mirtazapine added. Follow up in 3 weeks.  - ALPRAZolam (XANAX) 0.25 MG tablet; Take 1 tablet (0.25 mg total) by mouth 3 (three) times daily as needed for anxiety or sleep.  Dispense: 30 tablet; Refill: 0 - mirtazapine (REMERON) 15 MG tablet; Take 1 tablet (15 mg total) by mouth at bedtime.  Dispense: 30 tablet; Refill: 1    General Counseling: [name] verbalizes understanding of the findings of todays visit and agrees with plan of treatment. I have discussed any further diagnostic evaluation that may be needed or ordered today. We also reviewed her medications today. she has been encouraged to call the office with any questions or concerns that should arise related to todays visit.    Counseling:    No orders of the defined types were placed in this encounter.   Meds ordered this encounter  Medications   ALPRAZolam (XANAX) 0.25 MG tablet     Sig: Take 1 tablet (0.25 mg total) by mouth 3 (three) times daily as needed for anxiety or sleep.    Dispense:  30 tablet    Refill:  0    Please note increased frequency of medication, discontinue previous order.   mirtazapine (REMERON) 15 MG tablet    Sig: Take 1 tablet (15 mg total) by mouth at bedtime.    Dispense:  30 tablet    Refill:  1    Return in about 3 weeks (around 09/24/2021) for F/U, eval new med, Anxiety/depression, Babak Lucus PCP.  [place] Controlled Substance Database was reviewed by me for overdose risk score (ORS)  Time  spent:30 Minutes Time spent with patient included reviewing progress notes, labs, imaging studies, and discussing plan for follow up.   This patient was seen by Sallyanne Kuster, FNP-C in collaboration with Dr. Beverely Risen as a part of collaborative care agreement.  Cortni Tays R. Tedd Sias, MSN, FNP-C Internal Medicine

## 2021-09-16 ENCOUNTER — Other Ambulatory Visit: Payer: Self-pay | Admitting: Nurse Practitioner

## 2021-09-16 DIAGNOSIS — F410 Panic disorder [episodic paroxysmal anxiety] without agoraphobia: Secondary | ICD-10-CM

## 2021-09-16 DIAGNOSIS — F411 Generalized anxiety disorder: Secondary | ICD-10-CM

## 2021-09-16 NOTE — Telephone Encounter (Signed)
Please review in send

## 2021-09-17 ENCOUNTER — Telehealth: Payer: Self-pay

## 2021-09-17 NOTE — Telephone Encounter (Signed)
Med sent to pharmacy.

## 2021-09-22 ENCOUNTER — Telehealth: Payer: Self-pay

## 2021-09-22 ENCOUNTER — Encounter: Payer: Self-pay | Admitting: Nurse Practitioner

## 2021-09-22 ENCOUNTER — Other Ambulatory Visit: Payer: Self-pay

## 2021-09-22 ENCOUNTER — Ambulatory Visit (INDEPENDENT_AMBULATORY_CARE_PROVIDER_SITE_OTHER): Payer: PPO | Admitting: Nurse Practitioner

## 2021-09-22 VITALS — BP 118/84 | HR 104 | Temp 99.0°F | Resp 16 | Ht 64.0 in | Wt 121.2 lb

## 2021-09-22 DIAGNOSIS — E538 Deficiency of other specified B group vitamins: Secondary | ICD-10-CM

## 2021-09-22 DIAGNOSIS — L299 Pruritus, unspecified: Secondary | ICD-10-CM | POA: Diagnosis not present

## 2021-09-22 DIAGNOSIS — F411 Generalized anxiety disorder: Secondary | ICD-10-CM | POA: Diagnosis not present

## 2021-09-22 DIAGNOSIS — F410 Panic disorder [episodic paroxysmal anxiety] without agoraphobia: Secondary | ICD-10-CM

## 2021-09-22 DIAGNOSIS — D649 Anemia, unspecified: Secondary | ICD-10-CM

## 2021-09-22 DIAGNOSIS — F458 Other somatoform disorders: Secondary | ICD-10-CM

## 2021-09-22 MED ORDER — MIRTAZAPINE 30 MG PO TBDP: 30.0000 mg | ORAL_TABLET | Freq: Every day | ORAL | 0 refills | Status: DC

## 2021-09-22 NOTE — Progress Notes (Signed)
[facility] [address] [place], [place] [address]  Internal MEDICINE  Office Visit Note  Patient Name: Phyllis Murphy  [id number] 03, 2067  [id number]  Date of Service: 09/22/2021  Chief Complaint  Patient presents with   Follow-up    Discuss meds   Hypertension   Anxiety   Depression    HPI Phyllis SprangDavona presents for follow-up visit for hypertension, anxiety and itching.  At her previous couple of office visits she was initially treated for scabies.  She will return for another office visit after going to the ER having a provider in the ER she is crazy.  She now realizes that the itching is contributing to her increased anxiety.     Current Medication: Outpatient Encounter Medications as of 09/22/2021  Medication Sig Note   ALPRAZolam (XANAX) 0.25 MG tablet Take 1 tablet (0.25 mg total) by mouth 3 (three) times daily as needed for anxiety or sleep.    amLODipine (NORVASC) 5 MG tablet TAKE 1 TABLET BY MOUTH ONCE DAILY    colchicine 0.6 MG tablet Take 1 tablet (0.6 mg total) by mouth 2 (two) times daily.    cyclobenzaprine (FLEXERIL) 10 MG tablet Take 10 mg by mouth 4 (four) times daily.  04/07/2015: Received from: External Pharmacy   doxepin (SINEQUAN) 10 MG capsule Take 10 mg by mouth at bedtime.    fexofenadine (ALLEGRA) 180 MG tablet Take 180 mg by mouth daily.    mirtazapine (REMERON SOL-TAB) 30 MG disintegrating tablet Take 1 tablet (30 mg total) by mouth at bedtime.    oxyCODONE-acetaminophen (PERCOCET) 7.5-325 MG tablet TAKE 1 TABLET BY MOUTH TWICE A DAY FOR TOTAL DOSE OF 7.5/5/7.5/5. TO FILL 01-15-16. 01/21/2016: Received from: External Pharmacy   Tdap (BOOSTRIX) 5-2.5-18.5 LF-MCG/0.5 injection Inject 0.5 mLs into the muscle once.    triamcinolone cream (KENALOG) 0.1 % Apply 1 application topically 2 (two) times daily. Apply a thin layer to areas bid    [DISCONTINUED] mirtazapine (REMERON) 15 MG tablet Take 1 tablet (15 mg total) by mouth at bedtime.    No  facility-administered encounter medications on file as of 09/22/2021.    Surgical History: Past Surgical History:  Procedure Laterality Date   ABDOMINAL HYSTERECTOMY     AUGMENTATION MAMMAPLASTY Bilateral 2004   BREAST ENHANCEMENT SURGERY     CERVICAL FUSION     ELBOW SURGERY     two surgeries 6 yrs ago 2017   trigger finger release surgery Right 09/30/2021    Medical History: Past Medical History:  Diagnosis Date   DDD (degenerative disc disease), cervical    History of breast augmentation    2007   Hx of hysterectomy    Hypertension     Family History: Family History  Problem Relation Age of Onset   Brain cancer Mother    Hypertension Father    Diabetes Father    Stroke Father    Breast cancer Neg Hx     Social History   Socioeconomic History   Marital status: Married    Spouse name: Not on file   Number of children: Not on file   Years of education: Not on file   Highest education level: Not on file  Occupational History   Occupation: [employer] started 2019  Tobacco Use   Smoking status: Some Days    Packs/day: 1.00    Years: 35.00    Pack years: 35.00    Types: Cigarettes, E-cigarettes    Start date: 08/22/1980    Last attempt to  quit: 11/20/2020    Years since quitting: 0.8   Smokeless tobacco: Never   Tobacco comments:    Vapes sometimes   Vaping Use   Vaping Use: Every day   Devices: no nictonie  Substance and Sexual Activity   Alcohol use: No    Alcohol/week: 0.0 standard drinks   Drug use: No   Sexual activity: Yes  Other Topics Concern   Not on file  Social History Narrative   ** Merged History Encounter **       Social Determinants of Health   Financial Resource Strain: Not on file  Food Insecurity: Not on file  Transportation Needs: Not on file  Physical Activity: Not on file  Stress: Not on file  Social Connections: Not on file  Intimate Partner Violence: Not on file      Review of Systems  Constitutional:  Negative for chills,  fatigue and unexpected weight change.  HENT:  Negative for congestion, rhinorrhea, sneezing and sore throat.   Eyes:  Negative for redness.  Respiratory: Negative.  Negative for cough, chest tightness, shortness of breath and wheezing.   Cardiovascular: Negative.  Negative for chest pain and palpitations.  Gastrointestinal:  Negative for abdominal pain, constipation, diarrhea, nausea and vomiting.  Genitourinary:  Negative for dysuria and frequency.  Musculoskeletal:  Negative for arthralgias, back pain, joint swelling and neck pain.  Skin:  Negative for rash.       Itching  Neurological: Negative.  Negative for tremors and numbness.  Hematological:  Negative for adenopathy. Does not bruise/bleed easily.  Psychiatric/Behavioral:  Negative for behavioral problems (Depression), self-injury, sleep disturbance and suicidal ideas. The patient is nervous/anxious.    Vital Signs: BP 118/84    Pulse (!) 104    Temp 99 F (37.2 C)    Resp 16    Ht 5\' 4"  (1.626 m)    Wt 121 lb 3.2 oz (55 kg)    SpO2 99%    BMI 20.80 kg/m    Physical Exam Vitals reviewed.  Constitutional:      General: She is not in acute distress.    Appearance: Normal appearance. She is normal weight. She is not ill-appearing.  HENT:     Head: Normocephalic and atraumatic.  Eyes:     Pupils: Pupils are equal, round, and reactive to light.  Cardiovascular:     Rate and Rhythm: Normal rate and regular rhythm.  Pulmonary:     Effort: Pulmonary effort is normal. No respiratory distress.  Neurological:     Mental Status: She is alert and oriented to person, place, and time.  Psychiatric:        Mood and Affect: Mood normal.        Behavior: Behavior normal.       Assessment/Plan: 1. Itching The itching most likely is attributed to anxiety. She was previously treated for scabies and also treated herself with OTC treatments   2. Anemia, unspecified type Had labs drawn by her dermatologist and she is anemic with  hemoglobin of 7.0. additional labs will need to be drawn.   3. Psychogenic pruritus Mirtazapine prescribed to decrease itching related to increased anxiety.  - mirtazapine (REMERON SOL-TAB) 30 MG disintegrating tablet; Take 1 tablet (30 mg total) by mouth at bedtime.  Dispense: 90 tablet; Refill: 0  4. Generalized anxiety disorder with panic attacks Mirtazapine prescribed to help decrease anxiety, improve sleep and decrease itching related to anxiety.  - mirtazapine (REMERON SOL-TAB) 30 MG disintegrating tablet; Take 1  tablet (30 mg total) by mouth at bedtime.  Dispense: 90 tablet; Refill: 0   General Counseling: Nyoka verbalizes understanding of the findings of todays visit and agrees with plan of treatment. I have discussed any further diagnostic evaluation that may be needed or ordered today. We also reviewed her medications today. she has been encouraged to call the office with any questions or concerns that should arise related to todays visit.    No orders of the defined types were placed in this encounter.   Meds ordered this encounter  Medications   mirtazapine (REMERON SOL-TAB) 30 MG disintegrating tablet    Sig: Take 1 tablet (30 mg total) by mouth at bedtime.    Dispense:  90 tablet    Refill:  0    Return in about 3 months (around 12/20/2021) for F/U, anxiety med refill, Vidal Lampkins PCP.   Total time spent:30 Minutes Time spent includes review of chart, medications, test results, and follow up plan with the patient.   [place] Controlled Substance Database was reviewed by me.  This patient was seen by Sallyanne Kuster, FNP-C in collaboration with Dr. Beverely Risen as a part of collaborative care agreement.   Sabrin Dunlevy R. Tedd Sias, MSN, FNP-C Internal medicine

## 2021-09-22 NOTE — Telephone Encounter (Signed)
Pt advised by desha and let her know that her hemoglobin is low, we have ordered further add on labs and will call her with a plan once the lab results come through. If not we have to add on more labs if unable to add on

## 2021-09-22 NOTE — Telephone Encounter (Signed)
Spoke to pt and informed her that her hemoglobin was low

## 2021-09-23 ENCOUNTER — Other Ambulatory Visit: Payer: Self-pay | Admitting: Internal Medicine

## 2021-09-23 ENCOUNTER — Telehealth: Payer: Self-pay

## 2021-09-23 DIAGNOSIS — D649 Anemia, unspecified: Secondary | ICD-10-CM

## 2021-09-23 NOTE — Telephone Encounter (Signed)
Spoke with pt she unable do labs due to she had carpel tunnel surgery advised her go to ED if having any symptoms for tired and weakness go to ED

## 2021-09-23 NOTE — Telephone Encounter (Signed)
Spoke with that we add labs if she can do stat labs she is unable to go for labs due to pt  had carpel tunnel surgery today so she unable to go for one week as per alyssa its ok and also advised that if she feels tired or weak she can go to ED

## 2021-09-23 NOTE — Telephone Encounter (Signed)
error 

## 2021-09-24 ENCOUNTER — Other Ambulatory Visit: Payer: Self-pay

## 2021-09-28 LAB — CBC WITH DIFFERENTIAL/PLATELET
Basophils Absolute: 0.1 10*3/uL (ref 0.0–0.2)
Basos: 1 %
EOS (ABSOLUTE): 0.1 10*3/uL (ref 0.0–0.4)
Eos: 1 %
Hematocrit: 29.1 % — ABNORMAL LOW (ref 34.0–46.6)
Hemoglobin: 8 g/dL — ABNORMAL LOW (ref 11.1–15.9)
Immature Grans (Abs): 0 10*3/uL (ref 0.0–0.1)
Immature Granulocytes: 0 %
Lymphocytes Absolute: 1.6 10*3/uL (ref 0.7–3.1)
Lymphs: 28 %
MCH: 18.8 pg — ABNORMAL LOW (ref 26.6–33.0)
MCHC: 27.5 g/dL — ABNORMAL LOW (ref 31.5–35.7)
MCV: 68 fL — ABNORMAL LOW (ref 79–97)
Monocytes Absolute: 0.4 10*3/uL (ref 0.1–0.9)
Monocytes: 7 %
Neutrophils Absolute: 3.5 10*3/uL (ref 1.4–7.0)
Neutrophils: 63 %
Platelets: 517 10*3/uL — ABNORMAL HIGH (ref 150–450)
RBC: 4.26 x10E6/uL (ref 3.77–5.28)
RDW: 17.3 % — ABNORMAL HIGH (ref 11.7–15.4)
WBC: 5.6 10*3/uL (ref 3.4–10.8)

## 2021-09-28 LAB — IRON,TIBC AND FERRITIN PANEL
Ferritin: 9 ng/mL — ABNORMAL LOW (ref 15–150)
Iron Saturation: 3 % — CL (ref 15–55)
Iron: 17 ug/dL — ABNORMAL LOW (ref 27–159)
Total Iron Binding Capacity: 567 ug/dL (ref 250–450)
UIBC: 550 ug/dL — ABNORMAL HIGH (ref 131–425)

## 2021-09-28 LAB — B12 AND FOLATE PANEL
Folate: 5.9 ng/mL (ref >3.0)
Vitamin B-12: 365 pg/mL (ref 232–1245)

## 2021-09-29 ENCOUNTER — Telehealth: Payer: Self-pay

## 2021-09-29 NOTE — Telephone Encounter (Signed)
-----   Message from Alyssa Abernathy, NP sent at 09/29/2021  6:07 AM EST ----- °Please let her know her hemoglobin was 8.0 on her most recent CBC which is still low but slightly better than before. Also her ferritin and iron levels are low. B12 and folate were ok. I would like for her to come in for a follow up to discuss these labs and the plan. Please have Toni schedule her after discussing the results and have her come in this week or next week with me, no later than next week °

## 2021-09-29 NOTE — Telephone Encounter (Signed)
Spoke to patient about lab results and that she will need to follow up with [place] in about 1 week per Alyssa. Patient states she can not drive until 0/30 or will need a virtual appointment instead.

## 2021-09-29 NOTE — Progress Notes (Signed)
Please let her know her hemoglobin was 8.0 on her most recent CBC which is still low but slightly better than before. Also her ferritin and iron levels are low. B12 and folate were ok. I would like for her to come in for a follow up to discuss these labs and the plan. Please have Sheralyn Boatman schedule her after discussing the results and have her come in this week or next week with me, no later than next week

## 2021-09-30 ENCOUNTER — Telehealth: Payer: Self-pay

## 2021-09-30 HISTORY — PX: OTHER SURGICAL HISTORY: SHX169

## 2021-09-30 NOTE — Telephone Encounter (Signed)
-----   Message from Jonetta Osgood, NP sent at 09/29/2021  6:07 AM EST ----- Please let her know her hemoglobin was 8.0 on her most recent CBC which is still low but slightly better than before. Also her ferritin and iron levels are low. B12 and folate were ok. I would like for her to come in for a follow up to discuss these labs and the plan. Please have Vivien Rota schedule her after discussing the results and have her come in this week or next week with me, no later than next week

## 2021-09-30 NOTE — Telephone Encounter (Signed)
Spoke to pt, she was already aware of labs, she has made an appt.

## 2021-09-30 NOTE — Telephone Encounter (Signed)
Pt aware of labs and has made appt

## 2021-10-08 ENCOUNTER — Ambulatory Visit (INDEPENDENT_AMBULATORY_CARE_PROVIDER_SITE_OTHER): Payer: PPO | Admitting: Nurse Practitioner

## 2021-10-08 ENCOUNTER — Encounter: Payer: Self-pay | Admitting: Nurse Practitioner

## 2021-10-08 ENCOUNTER — Other Ambulatory Visit: Payer: Self-pay

## 2021-10-08 VITALS — BP 120/90 | HR 91 | Temp 99.0°F | Resp 16 | Ht 64.0 in | Wt 123.0 lb

## 2021-10-08 DIAGNOSIS — B370 Candidal stomatitis: Secondary | ICD-10-CM

## 2021-10-08 DIAGNOSIS — R790 Abnormal level of blood mineral: Secondary | ICD-10-CM

## 2021-10-08 DIAGNOSIS — E538 Deficiency of other specified B group vitamins: Secondary | ICD-10-CM | POA: Diagnosis not present

## 2021-10-08 DIAGNOSIS — D508 Other iron deficiency anemias: Secondary | ICD-10-CM | POA: Diagnosis not present

## 2021-10-08 MED ORDER — CYANOCOBALAMIN 1000 MCG/ML IJ SOLN
1000.0000 ug | Freq: Once | INTRAMUSCULAR | Status: AC
  Administered 2021-10-08: 1000 ug via INTRAMUSCULAR

## 2021-10-08 MED ORDER — FLUCONAZOLE 150 MG PO TABS: 150.0000 mg | ORAL_TABLET | Freq: Once | ORAL | 0 refills | Status: DC

## 2021-10-08 NOTE — Progress Notes (Signed)
[facility] [place], [place] [address]  Internal MEDICINE  Office Visit Note  Patient Name: Phyllis Murphy  [id number]  [id number]  Date of Service: 10/08/2021  Chief Complaint  Patient presents with   Follow-up    Wound care on right elbow    Hypertension   Results    HPI Phyllis Murphy presents for a follow up visit to discuss abnormal lab results. She also had surgery last week - she had surgery on her right hand for trigger finger release x2, carpal tunnel release and cubital tunnel release on the right elbow. She is healing well but needs new steri-strips applies to the incision on her elbow. Prior to her surgery, her dermatologist had drawn some labs and called [facility] to report a low hemoglobin of 7.0 and sent the record of the labs to the clinic. This was the day of her surgery.  She recently got the repeat labs drawn and her hemoglobin is now 8.0. Other significant abnormal labs are listed below: Ferritin 9 LOW Iron 17 LOW Hemoglobin 8.0 Hematocrit 29.1 B12 365 (borderline/low normal) Folate 5.9 (borderline/low normal)  Patient reports that she has a history of low iron but cannot tolerate the oral iron supplements due to nausea. She has previously had a hysterectomy so she does not have a period. She denies any frank blood in stools or black tarry stools. She denies any vomiting.  Tongue is sore, cracked and patchy white. It is painful and bothering her.    Current Medication: Outpatient Encounter Medications as of 10/08/2021  Medication Sig Note   ALPRAZolam (XANAX) 0.25 MG tablet Take 1 tablet (0.25 mg total) by mouth 3 (three) times daily as needed for anxiety or sleep.    amLODipine (NORVASC) 5 MG tablet TAKE 1 TABLET BY MOUTH ONCE DAILY    colchicine 0.6 MG tablet Take 1 tablet (0.6 mg total) by mouth 2 (two) times daily.    cyclobenzaprine (FLEXERIL) 10 MG tablet Take 10 mg by mouth 4 (four) times daily.  04/07/2015: Received from: External  Pharmacy   doxepin (SINEQUAN) 10 MG capsule Take 10 mg by mouth at bedtime.    fexofenadine (ALLEGRA) 180 MG tablet Take 180 mg by mouth daily.    fluconazole (DIFLUCAN) 150 MG tablet Take 1 tablet (150 mg total) by mouth once for 1 dose.    mirtazapine (REMERON SOL-TAB) 30 MG disintegrating tablet Take 1 tablet (30 mg total) by mouth at bedtime.    oxyCODONE-acetaminophen (PERCOCET) 7.5-325 MG tablet TAKE 1 TABLET BY MOUTH TWICE A DAY FOR TOTAL DOSE OF 7.5/5/7.5/5. TO FILL 01-15-16. 01/21/2016: Received from: External Pharmacy   Tdap (Marydel) 5-2.5-18.5 LF-MCG/0.5 injection Inject 0.5 mLs into the muscle once.    triamcinolone cream (KENALOG) 0.1 % Apply 1 application topically 2 (two) times daily. Apply a thin layer to areas bid    [EXPIRED] cyanocobalamin ((VITAMIN B-12)) injection 1,000 mcg     No facility-administered encounter medications on file as of 10/08/2021.    Surgical History: Past Surgical History:  Procedure Laterality Date   ABDOMINAL HYSTERECTOMY     AUGMENTATION MAMMAPLASTY Bilateral 2004   BREAST ENHANCEMENT SURGERY     CERVICAL FUSION     ELBOW SURGERY     two surgeries 6 yrs ago 2017   trigger finger release surgery Right 09/30/2021    Medical History: Past Medical History:  Diagnosis Date   DDD (degenerative disc disease), cervical    History of breast augmentation  2007   Hx of hysterectomy    Hypertension     Family History: Family History  Problem Relation Age of Onset   Brain cancer Mother    Hypertension Father    Diabetes Father    Stroke Father    Breast cancer Neg Hx     Social History   Socioeconomic History   Marital status: Married    Spouse name: Not on file   Number of children: Not on file   Years of education: Not on file   Highest education level: Not on file  Occupational History   Occupation: [employer] started 2019  Tobacco Use   Smoking status: Some Days    Packs/day: 1.00    Years: 35.00    Pack years: 35.00    Types:  Cigarettes, E-cigarettes    Start date: 08/22/1980    Last attempt to quit: 11/20/2020    Years since quitting: 0.8   Smokeless tobacco: Never   Tobacco comments:    Vapes sometimes   Vaping Use   Vaping Use: Every day   Devices: no nictonie  Substance and Sexual Activity   Alcohol use: No    Alcohol/week: 0.0 standard drinks   Drug use: No   Sexual activity: Yes  Other Topics Concern   Not on file  Social History Narrative   ** Merged History Encounter **       Social Determinants of Health   Financial Resource Strain: Not on file  Food Insecurity: Not on file  Transportation Needs: Not on file  Physical Activity: Not on file  Stress: Not on file  Social Connections: Not on file  Intimate Partner Violence: Not on file      Review of Systems  Constitutional:  Positive for fatigue. Negative for appetite change, chills and unexpected weight change.  HENT:  Negative for congestion, rhinorrhea, sneezing and sore throat.   Eyes:  Negative for redness.  Respiratory: Negative.  Negative for cough, chest tightness, shortness of breath and wheezing.   Cardiovascular:  Negative for chest pain and palpitations.  Gastrointestinal: Negative.  Negative for abdominal pain, anal bleeding, blood in stool, constipation, diarrhea, nausea and vomiting.       Normal but does take stool softener regularly  Genitourinary:  Negative for dysuria and frequency.  Musculoskeletal:  Positive for back pain. Negative for arthralgias, joint swelling and neck pain.  Skin: Negative.  Negative for pallor and rash.  Neurological: Negative.  Negative for tremors and numbness.  Hematological:  Negative for adenopathy. Does not bruise/bleed easily.  Psychiatric/Behavioral:  Negative for behavioral problems (Depression), self-injury, sleep disturbance and suicidal ideas. The patient is nervous/anxious.    Vital Signs: BP 120/90    Pulse 91    Temp 99 F (37.2 C)    Resp 16    Ht 5\' 4"  (1.626 m)    Wt 123 lb  (55.8 kg)    SpO2 99%    BMI 21.11 kg/m    Physical Exam Constitutional:      General: She is not in acute distress.    Appearance: Normal appearance. She is normal weight. She is not ill-appearing.  HENT:     Head: Normocephalic and atraumatic.  Eyes:     Pupils: Pupils are equal, round, and reactive to light.  Cardiovascular:     Rate and Rhythm: Normal rate and regular rhythm.  Pulmonary:     Effort: Pulmonary effort is normal. No respiratory distress.  Neurological:     Mental  Status: She is alert and oriented to person, place, and time.     Cranial Nerves: No cranial nerve deficit.     Coordination: Coordination normal.     Gait: Gait normal.  Psychiatric:        Mood and Affect: Mood normal.        Behavior: Behavior normal.       Assessment/Plan: 1. Other iron deficiency anemia Cannot tolerate oral iron supplements due to severe nausea. Refer to hematology for further evaluation and possible iron infusions - Ambulatory referral to Hematology / Oncology  2. Low serum ferritin level See problem #1; refer to hematology for further evaluation - Ambulatory referral to Hematology / Oncology  3. B12 deficiency Administered B12 injection in office today, may repeat in 1 month or discuss with hematology - cyanocobalamin ((VITAMIN B-12)) injection 1,000 mcg  4. Low folate Encouraged patient to take additional OTC folic acid supplement [id number] mcg daily.   5. Oral thrush Fluconazole prescribed to treat. Encouraged patient to try orajel mouthwash - fluconazole (DIFLUCAN) 150 MG tablet; Take 1 tablet (150 mg total) by mouth once for 1 dose.  Dispense: 1 tablet; Refill: 0  6. Other: surgical incisions on left hand and elbow are healing well with no redness, swelling or drainage. Pain is manageable per patient report. Steristrips applied to right elbow incision.   General Counseling: Phyllis Murphy verbalizes understanding of the findings of todays visit and agrees with plan of  treatment. I have discussed any further diagnostic evaluation that may be needed or ordered today. We also reviewed her medications today. she has been encouraged to call the office with any questions or concerns that should arise related to todays visit.    Orders Placed This Encounter  Procedures   Ambulatory referral to Hematology / Oncology    Meds ordered this encounter  Medications   cyanocobalamin ((VITAMIN B-12)) injection 1,000 mcg   fluconazole (DIFLUCAN) 150 MG tablet    Sig: Take 1 tablet (150 mg total) by mouth once for 1 dose.    Dispense:  1 tablet    Refill:  0    Return in about 3 months (around 01/05/2022) for F/U, anxiety med refill, Ajax Schroll PCP.   Total time spent:30 Minutes Time spent includes review of chart, medications, test results, and follow up plan with the patient.   [place] Controlled Substance Database was reviewed by me.  This patient was seen by Jonetta Osgood, FNP-C in collaboration with Dr. Clayborn Bigness as a part of collaborative care agreement.   Alma Mohiuddin R. Valetta Fuller, MSN, FNP-C Internal medicine

## 2021-10-10 ENCOUNTER — Telehealth: Payer: PPO | Admitting: Nurse Practitioner

## 2021-10-10 DIAGNOSIS — R399 Unspecified symptoms and signs involving the genitourinary system: Secondary | ICD-10-CM | POA: Diagnosis not present

## 2021-10-10 MED ORDER — CEPHALEXIN 500 MG PO CAPS: 500.0000 mg | ORAL_CAPSULE | Freq: Two times a day (BID) | ORAL | 0 refills | Status: AC

## 2021-10-10 MED ORDER — PHENAZOPYRIDINE HCL 100 MG PO TABS: 100.0000 mg | ORAL_TABLET | Freq: Three times a day (TID) | ORAL | 0 refills | Status: AC | PRN

## 2021-10-10 NOTE — Progress Notes (Signed)
Virtual Visit Consent   Phyllis Murphy, you are scheduled for a virtual visit with a [facility] provider today.     Just as with appointments in the office, your consent must be obtained to participate.  Your consent will be active for this visit and any virtual visit you may have with one of our providers in the next 365 days.     If you have a MyChart account, a copy of this consent can be sent to you electronically.  All virtual visits are billed to your insurance company just like a traditional visit in the office.    As this is a virtual visit, video technology does not allow for your provider to perform a traditional examination.  This may limit your provider's ability to fully assess your condition.  If your provider identifies any concerns that need to be evaluated in person or the need to arrange testing (such as labs, EKG, etc.), we will make arrangements to do so.     Although advances in technology are sophisticated, we cannot ensure that it will always work on either your end or our end.  If the connection with a video visit is poor, the visit may have to be switched to a telephone visit.  With either a video or telephone visit, we are not always able to ensure that we have a secure connection.     I need to obtain your verbal consent now.   Are you willing to proceed with your visit today? Yes   Phyllis Murphy has provided verbal consent on 10/10/2021 for a virtual visit (video or telephone).   Tish Men, NP   Date: 10/10/2021 10:05 AM   Virtual Visit via Video Note   I, Phyllis Murphy, connected with  Phyllis Murphy  ([id number], March 28, 1966) on 10/10/21 at 10:00 AM EST by a video-enabled telemedicine application and verified that I am speaking with the correct person using two identifiers.  Location: Patient: Virtual Visit Location Patient: Home Provider: Virtual Visit Location Provider: Home   I discussed the limitations of evaluation and  management by telemedicine and the availability of in person appointments. The patient expressed understanding and agreed to proceed.    History of Present Illness: Phyllis Murphy is a 56 y.o. who identifies as a female who was assigned female at birth, and is being seen today for urinary tract infections symptoms.  HPI: Urinary Tract Infection  This is a recurrent problem. The current episode started in the past 7 days. The problem occurs every urination. The problem has been gradually worsening. There has been no fever. She is Sexually active. Associated symptoms include frequency and urgency. Pertinent negatives include no chills, discharge, flank pain, hematuria or hesitancy. Treatments tried: cranberry juice and water. The treatment provided no relief. Her past medical history is significant for recurrent UTIs.   Problems:  Patient Active Problem List   Diagnosis Date Noted   Generalized anxiety disorder with panic attacks 02/06/2021   Idiopathic gout 11/21/2018   Drug-induced constipation 11/14/2018   Ganglion cyst of dorsum of left wrist 06/20/2018   Mixed incontinence urge and stress 06/20/2018   Tinea versicolor 06/20/2018   Mixed hyperlipidemia 07/13/2015   Tobacco abuse 07/13/2015   Cervical spinal stenosis 04/04/2012   Essential hypertension 04/04/2012   Back ache 02/03/2012   Hand numbness 02/03/2012    Allergies:  Allergies  Allergen Reactions   Esmolol Anaphylaxis   Latex Anaphylaxis   Bacitracin Hives and Itching  Other Hives    Dermabond   Sulfa Antibiotics Hives and Itching   Medications:  Current Outpatient Medications:    ALPRAZolam (XANAX) 0.25 MG tablet, Take 1 tablet (0.25 mg total) by mouth 3 (three) times daily as needed for anxiety or sleep., Disp: 90 tablet, Rfl: 0   amLODipine (NORVASC) 5 MG tablet, TAKE 1 TABLET BY MOUTH ONCE DAILY, Disp: 90 tablet, Rfl: 1   colchicine 0.6 MG tablet, Take 1 tablet (0.6 mg total) by mouth 2 (two) times daily.,  Disp: 30 tablet, Rfl: 0   cyclobenzaprine (FLEXERIL) 10 MG tablet, Take 10 mg by mouth 4 (four) times daily. , Disp: , Rfl: 2   doxepin (SINEQUAN) 10 MG capsule, Take 10 mg by mouth at bedtime., Disp: , Rfl:    fexofenadine (ALLEGRA) 180 MG tablet, Take 180 mg by mouth daily., Disp: , Rfl:    mirtazapine (REMERON SOL-TAB) 30 MG disintegrating tablet, Take 1 tablet (30 mg total) by mouth at bedtime., Disp: 90 tablet, Rfl: 0   oxyCODONE-acetaminophen (PERCOCET) 7.5-325 MG tablet, TAKE 1 TABLET BY MOUTH TWICE A DAY FOR TOTAL DOSE OF 7.5/5/7.5/5. TO FILL 01-15-16., Disp: , Rfl: 0   Tdap (BOOSTRIX) 5-2.5-18.5 LF-MCG/0.5 injection, Inject 0.5 mLs into the muscle once., Disp: , Rfl:    triamcinolone cream (KENALOG) 0.1 %, Apply 1 application topically 2 (two) times daily. Apply a thin layer to areas bid, Disp: 30 g, Rfl: 0  Observations/Objective: Patient is well-developed, well-nourished in no acute distress.  Resting comfortably at home.  Head is normocephalic, atraumatic.  No labored breathing.  Speech is clear and coherent with logical content.  Patient is alert and oriented at baseline.    Assessment and Plan: 1. Urinary tract infection symptoms - cephALEXin (KEFLEX) 500 MG capsule; Take 1 capsule (500 mg total) by mouth 2 (two) times daily for 7 days.  Dispense: 14 capsule; Refill: 0 - phenazopyridine (PYRIDIUM) 100 MG tablet; Take 1 tablet (100 mg total) by mouth 3 (three) times daily as needed for up to 3 days for pain.  Dispense: 10 tablet; Refill: 0  Symptoms consistent with urinary tract infection. Denies fever, chills, abdominal pain or flank pain. Symptoms do not correlate with pyelonephritis. Will prescribe keflex 500mg  twice daily  for UTI symptoms and Pyridium 100mg  up to 3 times daily as needed for dysuria. Patient encouraged to increase fluids, void every 2 hours, void 15-20 minutes after sexual intercourse. Follow up in the ER if fever, chills, flank pain or abdominal pain.  Follow-up with PCP if symptoms do not improve. Patient verbalizes understanding, all questions answered.   Follow Up Instructions: I discussed the assessment and treatment plan with the patient. The patient was provided an opportunity to ask questions and all were answered. The patient agreed with the plan and demonstrated an understanding of the instructions.  A copy of instructions were sent to the patient via MyChart unless otherwise noted below.    The patient was advised to call back or seek an in-person evaluation if the symptoms worsen or if the condition fails to improve as anticipated.  Time:  I spent 10 minutes with the patient via telehealth technology discussing the above problems/concerns.    Tish Men, NP

## 2021-10-10 NOTE — Patient Instructions (Addendum)
[name], thank you for joining Tish Men, NP for today's virtual visit.  While this provider is not your primary care provider (PCP), if your PCP is located in our provider database this encounter information will be shared with them immediately following your visit.  Consent: (Patient) Phyllis Murphy provided verbal consent for this virtual visit at the beginning of the encounter.  Current Medications:  Current Outpatient Medications:    cephALEXin (KEFLEX) 500 MG capsule, Take 1 capsule (500 mg total) by mouth 2 (two) times daily for 7 days., Disp: 14 capsule, Rfl: 0   phenazopyridine (PYRIDIUM) 100 MG tablet, Take 1 tablet (100 mg total) by mouth 3 (three) times daily as needed for up to 3 days for pain., Disp: 10 tablet, Rfl: 0   ALPRAZolam (XANAX) 0.25 MG tablet, Take 1 tablet (0.25 mg total) by mouth 3 (three) times daily as needed for anxiety or sleep., Disp: 90 tablet, Rfl: 0   amLODipine (NORVASC) 5 MG tablet, TAKE 1 TABLET BY MOUTH ONCE DAILY, Disp: 90 tablet, Rfl: 1   colchicine 0.6 MG tablet, Take 1 tablet (0.6 mg total) by mouth 2 (two) times daily., Disp: 30 tablet, Rfl: 0   cyclobenzaprine (FLEXERIL) 10 MG tablet, Take 10 mg by mouth 4 (four) times daily. , Disp: , Rfl: 2   doxepin (SINEQUAN) 10 MG capsule, Take 10 mg by mouth at bedtime., Disp: , Rfl:    fexofenadine (ALLEGRA) 180 MG tablet, Take 180 mg by mouth daily., Disp: , Rfl:    mirtazapine (REMERON SOL-TAB) 30 MG disintegrating tablet, Take 1 tablet (30 mg total) by mouth at bedtime., Disp: 90 tablet, Rfl: 0   oxyCODONE-acetaminophen (PERCOCET) 7.5-325 MG tablet, TAKE 1 TABLET BY MOUTH TWICE A DAY FOR TOTAL DOSE OF 7.5/5/7.5/5. TO FILL 01-15-16., Disp: , Rfl: 0   Tdap (BOOSTRIX) 5-2.5-18.5 LF-MCG/0.5 injection, Inject 0.5 mLs into the muscle once., Disp: , Rfl:    triamcinolone cream (KENALOG) 0.1 %, Apply 1 application topically 2 (two) times daily. Apply a thin layer to areas bid, Disp: 30 g,  Rfl: 0   Medications ordered in this encounter:  Meds ordered this encounter  Medications   cephALEXin (KEFLEX) 500 MG capsule    Sig: Take 1 capsule (500 mg total) by mouth 2 (two) times daily for 7 days.    Dispense:  14 capsule    Refill:  0   phenazopyridine (PYRIDIUM) 100 MG tablet    Sig: Take 1 tablet (100 mg total) by mouth 3 (three) times daily as needed for up to 3 days for pain.    Dispense:  10 tablet    Refill:  0     *If you need refills on other medications prior to your next appointment, please contact your pharmacy*  Follow-Up: Call back or seek an in-person evaluation if the symptoms worsen or if the condition fails to improve as anticipated.  Other Instructions Will prescribe keflex 500mg  twice daily and Pyridium 100mg  up to 3 times daily as needed for dysuria. Patient encouraged to increase fluids, void every 2 hours, void 15-20 minutes after sexual intercourse. Follow up in the ER if fever, chills, flank pain or abdominal pain. Follow-up with PCP if symptoms do not improve. Patient verbalizes understanding, all questions answered.    If you have been instructed to have an in-person evaluation today at a local Urgent Care facility, please use the link below. It will take you to a list of all of our available [facility], including address, phone number and hours of operation. Please do not delay care.  [facility]  If you or a family member do not have a primary care provider, use the link below to schedule a visit and establish care. When you choose a [facility] primary care physician or advanced practice provider, you gain a long-term partner in health. Find a Primary Care Provider  Learn more about [facility]'s in-office and virtual care options: [facility]

## 2021-10-11 ENCOUNTER — Encounter: Payer: Self-pay | Admitting: Nurse Practitioner

## 2021-10-12 ENCOUNTER — Other Ambulatory Visit: Payer: Self-pay | Admitting: Nurse Practitioner

## 2021-10-12 DIAGNOSIS — B370 Candidal stomatitis: Secondary | ICD-10-CM

## 2021-10-12 MED ORDER — FLUCONAZOLE 150 MG PO TABS: 150.0000 mg | ORAL_TABLET | Freq: Once | ORAL | 0 refills | Status: AC

## 2021-10-14 ENCOUNTER — Telehealth: Payer: Self-pay | Admitting: *Deleted

## 2021-10-14 NOTE — Telephone Encounter (Signed)
RN called and left message on voicemail regarding appointment tomorrow and if patient had any questions or needed directions to call clinic and number was given.

## 2021-10-15 ENCOUNTER — Inpatient Hospital Stay: Payer: PPO | Attending: Oncology | Admitting: Oncology

## 2021-10-15 ENCOUNTER — Encounter: Payer: Self-pay | Admitting: Oncology

## 2021-10-15 ENCOUNTER — Other Ambulatory Visit: Payer: Self-pay

## 2021-10-15 ENCOUNTER — Inpatient Hospital Stay: Payer: PPO

## 2021-10-15 ENCOUNTER — Other Ambulatory Visit: Payer: Self-pay | Admitting: Nurse Practitioner

## 2021-10-15 VITALS — BP 123/96 | HR 96 | Temp 96.0°F | Resp 14 | Ht 64.0 in | Wt 121.9 lb

## 2021-10-15 DIAGNOSIS — D508 Other iron deficiency anemias: Secondary | ICD-10-CM

## 2021-10-15 DIAGNOSIS — F410 Panic disorder [episodic paroxysmal anxiety] without agoraphobia: Secondary | ICD-10-CM

## 2021-10-15 DIAGNOSIS — Z90710 Acquired absence of both cervix and uterus: Secondary | ICD-10-CM | POA: Diagnosis not present

## 2021-10-15 DIAGNOSIS — Z808 Family history of malignant neoplasm of other organs or systems: Secondary | ICD-10-CM | POA: Diagnosis not present

## 2021-10-15 DIAGNOSIS — F17210 Nicotine dependence, cigarettes, uncomplicated: Secondary | ICD-10-CM | POA: Insufficient documentation

## 2021-10-15 DIAGNOSIS — D509 Iron deficiency anemia, unspecified: Secondary | ICD-10-CM | POA: Diagnosis present

## 2021-10-15 DIAGNOSIS — I10 Essential (primary) hypertension: Secondary | ICD-10-CM | POA: Insufficient documentation

## 2021-10-15 HISTORY — DX: Iron deficiency anemia, unspecified: D50.9

## 2021-10-15 MED ORDER — ALPRAZOLAM 0.25 MG PO TABS: 0.2500 mg | ORAL_TABLET | Freq: Three times a day (TID) | ORAL | 0 refills | Status: DC | PRN

## 2021-10-15 NOTE — Progress Notes (Signed)
Hematology/Oncology Consult note [phone] [phone]         Patient Care Team: Sallyanne Kuster, NP as PCP - General (Nurse Practitioner)  REFERRING PROVIDER: Sallyanne Kuster, NP  CHIEF COMPLAINTS/REASON FOR VISIT:  Evaluation of iron deficiency anemia  HISTORY OF PRESENTING ILLNESS:   Phyllis Murphy is a  56 y.o.  female with PMH listed below was seen in consultation at the request of  Sallyanne Kuster, NP  for evaluation of iron deficiency anemia Patient reports longstanding history of iron deficiency anemia. 09/27/2021, CBC showed a hemoglobin of 8.0, MCV 68, platelet count 517. Iron panel showed ferritin of 9, iron saturation 3, TIBC 567. She reports that she has previously tried oral iron supplementation and not able to tolerate She has a history of hysterectomy.  Denies any black tarry stool or blood in the stool. She has never had a colonoscopy.  Previously she has had Cologuard done. Denies unintentional weight loss, fever, night sweats.  She reports feeling tired and fatigued.  Review of Systems  Constitutional:  Positive for fatigue. Negative for appetite change, chills and fever.  HENT:   Negative for hearing loss and voice change.   Eyes:  Negative for eye problems.  Respiratory:  Negative for chest tightness and cough.   Cardiovascular:  Negative for chest pain.  Gastrointestinal:  Negative for abdominal distention, abdominal pain and blood in stool.  Endocrine: Negative for hot flashes.  Genitourinary:  Negative for difficulty urinating and frequency.   Musculoskeletal:  Negative for arthralgias.  Skin:  Negative for itching and rash.  Neurological:  Negative for extremity weakness.  Hematological:  Negative for adenopathy.  Psychiatric/Behavioral:  Negative for confusion.    MEDICAL HISTORY:  Past Medical History:  Diagnosis Date   Anxiety    DDD (degenerative disc disease), cervical    History of breast augmentation     2007   Hx of hysterectomy    Hypertension     SURGICAL HISTORY: Past Surgical History:  Procedure Laterality Date   ABDOMINAL HYSTERECTOMY     AUGMENTATION MAMMAPLASTY Bilateral 2004   BREAST ENHANCEMENT SURGERY     CERVICAL FUSION     ELBOW SURGERY     two surgeries 6 yrs ago 2017   trigger finger release surgery Right 09/30/2021    SOCIAL HISTORY: Social History   Socioeconomic History   Marital status: Married    Spouse name: Not on file   Number of children: Not on file   Years of education: Not on file   Highest education level: Not on file  Occupational History   Occupation: [employer] started 2019  Tobacco Use   Smoking status: Some Days    Packs/day: 1.00    Years: 35.00    Pack years: 35.00    Types: E-cigarettes, Cigarettes    Start date: 08/22/1980    Last attempt to quit: 11/20/2020    Years since quitting: 0.9   Smokeless tobacco: Never   Tobacco comments:    Vapes sometimes, pt reports no longer smokes cigarettes, but does vape some.   Vaping Use   Vaping Use: Every day   Devices: no nictonie  Substance and Sexual Activity   Alcohol use: No    Alcohol/week: 0.0 standard drinks   Drug use: No   Sexual activity: Yes  Other Topics Concern   Not on file  Social History Narrative   ** Merged History Encounter **       Social Determinants of Health  Financial Resource Strain: Not on file  Food Insecurity: Not on file  Transportation Needs: Not on file  Physical Activity: Not on file  Stress: Not on file  Social Connections: Not on file  Intimate Partner Violence: Not on file    FAMILY HISTORY: Family History  Problem Relation Age of Onset   Brain cancer Mother    Hypertension Father    Diabetes Father    Stroke Father    Breast cancer Neg Hx     ALLERGIES:  is allergic to esmolol, latex, bacitracin, other, and sulfa antibiotics.  MEDICATIONS:  Current Outpatient Medications  Medication Sig Dispense Refill   amLODipine (NORVASC) 5 MG tablet  TAKE 1 TABLET BY MOUTH ONCE DAILY 90 tablet 1   cephALEXin (KEFLEX) 500 MG capsule Take 1 capsule (500 mg total) by mouth 2 (two) times daily for 7 days. 14 capsule 0   colchicine 0.6 MG tablet Take 1 tablet (0.6 mg total) by mouth 2 (two) times daily. 30 tablet 0   cyclobenzaprine (FLEXERIL) 10 MG tablet Take 10 mg by mouth 4 (four) times daily.   2   diphenhydrAMINE (BENADRYL) 25 mg capsule Take 25 mg by mouth every 6 (six) hours as needed.     doxepin (SINEQUAN) 10 MG capsule Take 10 mg by mouth at bedtime.     fluconazole (DIFLUCAN) 150 MG tablet Take 150 mg by mouth once.     ibuprofen (ADVIL) 600 MG tablet ibuprofen 600 mg tablet  Take 1 tablet 3 times a day by oral route.     mirtazapine (REMERON SOL-TAB) 30 MG disintegrating tablet Take 1 tablet (30 mg total) by mouth at bedtime. 90 tablet 0   oxyCODONE-acetaminophen (PERCOCET) 7.5-325 MG tablet TAKE 1 TABLET BY MOUTH TWICE A DAY FOR TOTAL DOSE OF 7.5/5/7.5/5. TO FILL 01-15-16.  0   triamcinolone cream (KENALOG) 0.1 % Apply 1 application topically 2 (two) times daily. Apply a thin layer to areas bid 30 g 0   ALPRAZolam (XANAX) 0.25 MG tablet Take 1 tablet (0.25 mg total) by mouth 3 (three) times daily as needed for anxiety or sleep. 270 tablet 0   Tdap (BOOSTRIX) 5-2.5-18.5 LF-MCG/0.5 injection Inject 0.5 mLs into the muscle once. (Patient not taking: Reported on 10/15/2021)     No current facility-administered medications for this visit.     PHYSICAL EXAMINATION: ECOG PERFORMANCE STATUS: 0 - Asymptomatic Vitals:   10/15/21 1141  BP: (!) 123/96  Pulse: 96  Resp: 14  Temp: (!) 96 F (35.6 C)  SpO2: 100%   Filed Weights   10/15/21 1141  Weight: 121 lb 14.4 oz (55.3 kg)    Physical Exam Constitutional:      General: She is not in acute distress. HENT:     Head: Normocephalic and atraumatic.  Eyes:     General: No scleral icterus. Cardiovascular:     Rate and Rhythm: Normal rate and regular rhythm.     Heart sounds:  Normal heart sounds.  Pulmonary:     Effort: Pulmonary effort is normal. No respiratory distress.     Breath sounds: No wheezing.  Abdominal:     General: Bowel sounds are normal. There is no distension.     Palpations: Abdomen is soft.  Musculoskeletal:        General: No deformity. Normal range of motion.     Cervical back: Normal range of motion and neck supple.  Skin:    General: Skin is warm and dry.  Coloration: Skin is pale.     Findings: No erythema or rash.  Neurological:     Mental Status: She is alert and oriented to person, place, and time. Mental status is at baseline.     Cranial Nerves: No cranial nerve deficit.     Coordination: Coordination normal.  Psychiatric:        Mood and Affect: Mood normal.    LABORATORY DATA:  I have reviewed the data as listed Lab Results  Component Value Date   WBC 5.6 09/27/2021   HGB 8.0 (L) 09/27/2021   HCT 29.1 (L) 09/27/2021   MCV 68 (L) 09/27/2021   PLT 517 (H) 09/27/2021   Recent Labs    12/09/20 1322 05/03/21 1107  NA 139 133*  K 5.1 5.7*  CL 101 99  CO2 21 21  GLUCOSE 82 97  BUN 9 5*  CREATININE 0.62 0.81  CALCIUM 9.5 9.3  PROT 6.8 6.2  ALBUMIN 4.6 4.1  AST 15 12  ALT 11 8  ALKPHOS 77 80  BILITOT <0.2 <0.2   Iron/TIBC/Ferritin/ %Sat    Component Value Date/Time   IRON 17 (L) 09/27/2021 1320   TIBC 567 (HH) 09/27/2021 1320   FERRITIN 9 (L) 09/27/2021 1320   IRONPCTSAT 3 (LL) 09/27/2021 1320      RADIOGRAPHIC STUDIES: I have personally reviewed the radiological images as listed and agreed with the findings in the report. No results found.    ASSESSMENT & PLAN:  1. Iron deficiency anemia, unspecified iron deficiency anemia type    Labs reviewed and discussed with patient.  Results were compatible with iron deficiency anemia. Patient is not able to tolerate oral iron supplementation. Plan IV iron with Venofer 200mg  weekly x 5 doses. Risks of infusion reactions including anaphylactic reactions  were discussed with patient. Other side effects include but not limited to high blood pressure, headache,wheezing, SOB, skin rash and itchiness, weight gain, leg swelling, etc. Patient voices understanding and is willing to proceed.  Recommend patient to get gastroenterology work up. She will further discuss with her PCP.  Orders Placed This Encounter  Procedures   CBC with Differential/Platelet    Standing Status:   Future    Standing Expiration Date:   10/15/2022   Ferritin    Standing Status:   Future    Standing Expiration Date:   10/15/2022   Iron and TIBC    Standing Status:   Future    Standing Expiration Date:   10/15/2022    All questions were answered. The patient knows to call the clinic with any problems questions or concerns.  cc Sallyanne Kuster, NP    Return of visit: 3 months. Thank you for this kind referral and the opportunity to participate in the care of this patient. A copy of today's note is routed to referring provider   Rickard Patience, MD, PhD [facility] Hematology Oncology 10/15/2021

## 2021-10-15 NOTE — Telephone Encounter (Signed)
Please review

## 2021-10-15 NOTE — Telephone Encounter (Signed)
Med sent to pharmacy.

## 2021-10-15 NOTE — Progress Notes (Signed)
New pt referred by Dr Tedd Sias for iron deficiency anemia.  Pt denies any symptoms but states her blood count were low and she can not take iron pills.

## 2021-10-19 ENCOUNTER — Encounter: Payer: Self-pay | Admitting: Nurse Practitioner

## 2021-10-20 ENCOUNTER — Other Ambulatory Visit: Payer: Self-pay

## 2021-10-20 DIAGNOSIS — I10 Essential (primary) hypertension: Secondary | ICD-10-CM

## 2021-10-20 MED ORDER — AMLODIPINE BESYLATE 5 MG PO TABS: 5.0000 mg | ORAL_TABLET | Freq: Every day | ORAL | 1 refills | Status: DC

## 2021-10-22 ENCOUNTER — Inpatient Hospital Stay: Payer: PPO | Attending: Oncology

## 2021-10-22 ENCOUNTER — Other Ambulatory Visit: Payer: Self-pay

## 2021-10-22 VITALS — BP 123/91 | HR 103 | Temp 97.9°F | Resp 18

## 2021-10-22 DIAGNOSIS — D509 Iron deficiency anemia, unspecified: Secondary | ICD-10-CM | POA: Diagnosis present

## 2021-10-22 DIAGNOSIS — D508 Other iron deficiency anemias: Secondary | ICD-10-CM

## 2021-10-22 MED ORDER — IRON SUCROSE 20 MG/ML IV SOLN
200.0000 mg | Freq: Once | INTRAVENOUS | Status: AC
  Administered 2021-10-22: 200 mg via INTRAVENOUS
  Filled 2021-10-22: qty 10

## 2021-10-22 MED ORDER — SODIUM CHLORIDE 0.9 % IV SOLN: 200.0000 mg | Freq: Once | INTRAVENOUS | Status: DC

## 2021-10-22 MED ORDER — SODIUM CHLORIDE 0.9 % IV SOLN
Freq: Once | INTRAVENOUS | Status: AC
  Filled 2021-10-22: qty 250

## 2021-10-29 ENCOUNTER — Other Ambulatory Visit: Payer: Self-pay

## 2021-10-29 ENCOUNTER — Inpatient Hospital Stay: Payer: PPO

## 2021-10-29 VITALS — BP 126/93 | HR 93 | Temp 96.9°F | Resp 16

## 2021-10-29 DIAGNOSIS — D509 Iron deficiency anemia, unspecified: Secondary | ICD-10-CM | POA: Diagnosis not present

## 2021-10-29 DIAGNOSIS — D508 Other iron deficiency anemias: Secondary | ICD-10-CM

## 2021-10-29 MED ORDER — SODIUM CHLORIDE 0.9 % IV SOLN: 200.0000 mg | Freq: Once | INTRAVENOUS | Status: DC

## 2021-10-29 MED ORDER — IRON SUCROSE 20 MG/ML IV SOLN
200.0000 mg | Freq: Once | INTRAVENOUS | Status: AC
  Administered 2021-10-29: 200 mg via INTRAVENOUS
  Filled 2021-10-29: qty 10

## 2021-10-29 MED ORDER — SODIUM CHLORIDE 0.9 % IV SOLN
Freq: Once | INTRAVENOUS | Status: AC
  Filled 2021-10-29: qty 250

## 2021-10-29 NOTE — Patient Instructions (Signed)

## 2021-10-30 ENCOUNTER — Encounter: Payer: Self-pay | Admitting: Oncology

## 2021-10-31 ENCOUNTER — Other Ambulatory Visit: Payer: Self-pay | Admitting: Nurse Practitioner

## 2021-10-31 DIAGNOSIS — F458 Other somatoform disorders: Secondary | ICD-10-CM

## 2021-10-31 DIAGNOSIS — F410 Panic disorder [episodic paroxysmal anxiety] without agoraphobia: Secondary | ICD-10-CM

## 2021-10-31 DIAGNOSIS — F411 Generalized anxiety disorder: Secondary | ICD-10-CM

## 2021-11-02 ENCOUNTER — Encounter: Payer: Self-pay | Admitting: Nurse Practitioner

## 2021-11-05 ENCOUNTER — Other Ambulatory Visit: Payer: Self-pay

## 2021-11-05 ENCOUNTER — Inpatient Hospital Stay: Payer: PPO

## 2021-11-05 VITALS — BP 129/85 | HR 99 | Temp 97.2°F | Resp 18

## 2021-11-05 DIAGNOSIS — D508 Other iron deficiency anemias: Secondary | ICD-10-CM

## 2021-11-05 DIAGNOSIS — D509 Iron deficiency anemia, unspecified: Secondary | ICD-10-CM | POA: Diagnosis not present

## 2021-11-05 MED ORDER — ZOLPIDEM TARTRATE 10 MG PO TABS: 5.0000 mg | ORAL_TABLET | Freq: Every evening | ORAL | 1 refills | Status: DC | PRN

## 2021-11-05 MED ORDER — IRON SUCROSE 20 MG/ML IV SOLN
200.0000 mg | Freq: Once | INTRAVENOUS | Status: AC
  Administered 2021-11-05: 200 mg via INTRAVENOUS
  Filled 2021-11-05: qty 10

## 2021-11-05 MED ORDER — SODIUM CHLORIDE 0.9 % IV SOLN: 200.0000 mg | Freq: Once | INTRAVENOUS | Status: DC

## 2021-11-05 MED ORDER — SODIUM CHLORIDE 0.9 % IV SOLN
Freq: Once | INTRAVENOUS | Status: AC
  Filled 2021-11-05: qty 250

## 2021-11-05 NOTE — Telephone Encounter (Signed)
Spoke to pt, informed her med was sent to pharmacy  °

## 2021-11-05 NOTE — Patient Instructions (Signed)
[facility]  Discharge Instructions: ?Thank you for choosing [facility] to provide your oncology and hematology care.  ?If you have a lab appointment with the Cancer Center, please go directly to the Cancer Center and check in at the registration area. ? ?Wear comfortable clothing and clothing appropriate for easy access to any Portacath or PICC line.  ? ?We strive to give you quality time with your provider. You may need to reschedule your appointment if you arrive late (15 or more minutes).  Arriving late affects you and other patients whose appointments are after yours.  Also, if you miss three or more appointments without notifying the office, you may be dismissed from the clinic at the provider?s discretion.    ?  ?For prescription refill requests, have your pharmacy contact our office and allow 72 hours for refills to be completed.   ? ?Today you received the following chemotherapy and/or immunotherapy agents VENOFER    ?  ?To help prevent nausea and vomiting after your treatment, we encourage you to take your nausea medication as directed. ? ?BELOW ARE SYMPTOMS THAT SHOULD BE REPORTED IMMEDIATELY: ?*FEVER GREATER THAN 100.4 F (38 ?C) OR HIGHER ?*CHILLS OR SWEATING ?*NAUSEA AND VOMITING THAT IS NOT CONTROLLED WITH YOUR NAUSEA MEDICATION ?*UNUSUAL SHORTNESS OF BREATH ?*UNUSUAL BRUISING OR BLEEDING ?*URINARY PROBLEMS (pain or burning when urinating, or frequent urination) ?*BOWEL PROBLEMS (unusual diarrhea, constipation, pain near the anus) ?TENDERNESS IN MOUTH AND THROAT WITH OR WITHOUT PRESENCE OF ULCERS (sore throat, sores in mouth, or a toothache) ?UNUSUAL RASH, SWELLING OR PAIN  ?UNUSUAL VAGINAL DISCHARGE OR ITCHING  ? ?Items with * indicate a potential emergency and should be followed up as soon as possible or go to the Emergency Department if any problems should occur. ? ?Please show the CHEMOTHERAPY ALERT CARD or IMMUNOTHERAPY ALERT CARD at check-in to the  Emergency Department and triage nurse. ? ?Should you have questions after your visit or need to cancel or reschedule your appointment, please contact [facility]  [phone] and follow the prompts.  Office hours are 8:00 a.m. to 4:30 p.m. Monday - Friday. Please note that voicemails left after 4:00 p.m. may not be returned until the following business day.  We are closed weekends and major holidays. You have access to a nurse at all times for urgent questions. Please call the main number to the clinic [phone] and follow the prompts. ? ?For any non-urgent questions, you may also contact your provider using MyChart. We now offer e-Visits for anyone 18 and older to request care online for non-urgent symptoms. For details visit [email or web address]. ?  ?Also download the MyChart app! Go to the app store, search "MyChart", open the app, select [facility], and log in with your MyChart username and password. ? ?Due to Covid, a mask is required upon entering the hospital/clinic. If you do not have a mask, one will be given to you upon arrival. For doctor visits, patients may have 1 support person aged 18 or older with them. For treatment visits, patients cannot have anyone with them due to current Covid guidelines and our immunocompromised population.  ? ?Iron Sucrose Injection ?What is this medication? ?IRON SUCROSE (EYE ern SOO krose) treats low levels of iron (iron deficiency anemia) in people with kidney disease. Iron is a mineral that plays an important role in making red blood cells, which carry oxygen from your lungs to the rest of your body. ?This medicine may   be used for other purposes; ask your health care provider or pharmacist if you have questions. ?COMMON BRAND NAME(S): Venofer ?What should I tell my care team before I take this medication? ?They need to know if you have any of these conditions: ?Anemia not caused by low iron levels ?Heart disease ?High levels of  iron in the blood ?Kidney disease ?Liver disease ?An unusual or allergic reaction to iron, other medications, foods, dyes, or preservatives ?Pregnant or trying to get pregnant ?Breast-feeding ?How should I use this medication? ?This medication is for infusion into a vein. It is given in a hospital or clinic setting. ?Talk to your care team about the use of this medication in children. While this medication may be prescribed for children as young as 2 years for selected conditions, precautions do apply. ?Overdosage: If you think you have taken too much of this medicine contact a poison control center or emergency room at once. ?NOTE: This medicine is only for you. Do not share this medicine with others. ?What if I miss a dose? ?It is important not to miss your dose. Call your care team if you are unable to keep an appointment. ?What may interact with this medication? ?Do not take this medication with any of the following: ?Deferoxamine ?Dimercaprol ?Other iron products ?This medication may also interact with the following: ?Chloramphenicol ?Deferasirox ?This list may not describe all possible interactions. Give your health care provider a list of all the medicines, herbs, non-prescription drugs, or dietary supplements you use. Also tell them if you smoke, drink alcohol, or use illegal drugs. Some items may interact with your medicine. ?What should I watch for while using this medication? ?Visit your care team regularly. Tell your care team if your symptoms do not start to get better or if they get worse. You may need blood work done while you are taking this medication. ?You may need to follow a special diet. Talk to your care team. Foods that contain iron include: whole grains/cereals, dried fruits, beans, or peas, leafy green vegetables, and organ meats (liver, kidney). ?What side effects may I notice from receiving this medication? ?Side effects that you should report to your care team as soon as  possible: ?Allergic reactions--skin rash, itching, hives, swelling of the face, lips, tongue, or throat ?Low blood pressure--dizziness, feeling faint or lightheaded, blurry vision ?Shortness of breath ?Side effects that usually do not require medical attention (report to your care team if they continue or are bothersome): ?Flushing ?Headache ?Joint pain ?Muscle pain ?Nausea ?Pain, redness, or irritation at injection site ?This list may not describe all possible side effects. Call your doctor for medical advice about side effects. You may report side effects to FDA at 1-800-FDA-1088. ?Where should I keep my medication? ?This medication is given in a hospital or clinic and will not be stored at home. ?NOTE: This sheet is a summary. It may not cover all possible information. If you have questions about this medicine, talk to your doctor, pharmacist, or health care provider. ?? 2022 Elsevier/Gold Standard (2021-01-01 00:00:00) ? ?

## 2021-11-12 ENCOUNTER — Other Ambulatory Visit: Payer: Self-pay

## 2021-11-12 ENCOUNTER — Telehealth: Payer: Self-pay

## 2021-11-12 ENCOUNTER — Encounter: Payer: Self-pay | Admitting: Nurse Practitioner

## 2021-11-12 ENCOUNTER — Inpatient Hospital Stay: Payer: PPO

## 2021-11-12 MED ORDER — AMLODIPINE BESYLATE 10 MG PO TABS: 10.0000 mg | ORAL_TABLET | Freq: Every day | ORAL | 1 refills | Status: DC

## 2021-11-12 NOTE — Telephone Encounter (Signed)
Spoke to pt, she called back to inform us her BP was now 139/90. Informed her amlodipine dose will be increased to 10mg  daily per Alyssa, and transferred to [facility] to make appt in 2 weeks. ?

## 2021-11-12 NOTE — Progress Notes (Signed)
Initial BP 146/103. Waited 10 minutes for recheck, 158/101. Waited 15 more minutes and BP increased to 158/112. Patient denies any s/s. Dr. Cathie Hoops made aware. Per Dr. Cathie Hoops, rescheduled appt. Patient is to contact PCP and make them aware. Patient verbalizes understanding and denies any further questions.  ?

## 2021-11-13 ENCOUNTER — Telehealth: Payer: PPO | Admitting: Nurse Practitioner

## 2021-11-13 DIAGNOSIS — R040 Epistaxis: Secondary | ICD-10-CM | POA: Diagnosis not present

## 2021-11-13 NOTE — Progress Notes (Signed)
? ?Virtual Visit Consent  ? ?Phyllis Murphy, you are scheduled for a virtual visit with Mary-Margaret Daphine Deutscher, FNP, a [facility] provider, today.   ?  ?Just as with appointments in the office, your consent must be obtained to participate.  Your consent will be active for this visit and any virtual visit you may have with one of our providers in the next 365 days.   ?  ?If you have a MyChart account, a copy of this consent can be sent to you electronically.  All virtual visits are billed to your insurance company just like a traditional visit in the office.   ? ?As this is a virtual visit, video technology does not allow for your provider to perform a traditional examination.  This may limit your provider's ability to fully assess your condition.  If your provider identifies any concerns that need to be evaluated in person or the need to arrange testing (such as labs, EKG, etc.), we will make arrangements to do so.   ?  ?Although advances in technology are sophisticated, we cannot ensure that it will always work on either your end or our end.  If the connection with a video visit is poor, the visit may have to be switched to a telephone visit.  With either a video or telephone visit, we are not always able to ensure that we have a secure connection.    ? ?I need to obtain your verbal consent now.   Are you willing to proceed with your visit today? YES ?  ?Phyllis Murphy has provided verbal consent on 11/13/2021 for a virtual visit (video or telephone). ?  ?Mary-Margaret Daphine Deutscher, FNP  ? ?Date: 11/13/2021 4:55 PM ? ? ?Virtual Visit via Video Note  ? ?I, Mary-Margaret Daphine Deutscher, connected with Phyllis Murphy ([id number], March 24, 1966) on 11/13/21 at  5:00 PM EDT by a video-enabled telemedicine application and verified that I am speaking with the correct person using two identifiers. ? ?Location: ?Patient: Virtual Visit Location Patient: Home ?Provider: Virtual Visit Location Provider: Mobile ?  ?I discussed the  limitations of evaluation and management by telemedicine and the availability of in person appointments. The patient expressed understanding and agreed to proceed.   ? ?History of Present Illness: ?Phyllis Murphy is a 56 y.o. who identifies as a female who was assigned female at birth, and is being seen today for nose bleed. ? ?HPI: Patients states she has had a nose bleed all day. Has slowed down slightly. She had nose cauterized 2 weeks ago and had to go back yesterday to have cauterized again. She has tried holding pressure to nose and has used afrin nasal spray. States now that it is a slight trickle. ?  ?Review of Systems  ?Constitutional:  Negative for diaphoresis and weight loss.  ?HENT:  Positive for nosebleeds.   ?Eyes:  Negative for blurred vision, double vision and pain.  ?Respiratory:  Negative for shortness of breath.   ?Cardiovascular:  Negative for chest pain, palpitations, orthopnea and leg swelling.  ?Gastrointestinal:  Negative for abdominal pain.  ?Skin:  Negative for rash.  ?Neurological:  Negative for dizziness, sensory change, loss of consciousness, weakness and headaches.  ?Endo/Heme/Allergies:  Negative for polydipsia. Does not bruise/bleed easily.  ?Psychiatric/Behavioral:  Negative for memory loss. The patient does not have insomnia.   ?All other systems reviewed and are negative. ? ?Problems:  ?Patient Active Problem List  ? Diagnosis Date Noted  ? IDA (iron deficiency anemia) 10/15/2021  ?  Generalized anxiety disorder with panic attacks 02/06/2021  ? Idiopathic gout 11/21/2018  ? Drug-induced constipation 11/14/2018  ? Ganglion cyst of dorsum of left wrist 06/20/2018  ? Mixed incontinence urge and stress 06/20/2018  ? Tinea versicolor 06/20/2018  ? Mixed hyperlipidemia 07/13/2015  ? Tobacco abuse 07/13/2015  ? Cervical spinal stenosis 04/04/2012  ? Essential hypertension 04/04/2012  ? Back ache 02/03/2012  ? Hand numbness 02/03/2012  ?  ?Allergies:  ?Allergies  ?Allergen Reactions  ?  Esmolol Anaphylaxis  ? Latex Anaphylaxis  ? Bacitracin Hives and Itching  ? Other Hives  ?  Dermabond  ? Sulfa Antibiotics Hives and Itching  ? ?Medications:  ?Current Outpatient Medications:  ?  amLODipine (NORVASC) 10 MG tablet, Take 1 tablet (10 mg total) by mouth daily., Disp: 90 tablet, Rfl: 1 ?  colchicine 0.6 MG tablet, Take 1 tablet (0.6 mg total) by mouth 2 (two) times daily., Disp: 30 tablet, Rfl: 0 ?  cyclobenzaprine (FLEXERIL) 10 MG tablet, Take 10 mg by mouth 4 (four) times daily. , Disp: , Rfl: 2 ?  diphenhydrAMINE (BENADRYL) 25 mg capsule, Take 25 mg by mouth every 6 (six) hours as needed., Disp: , Rfl:  ?  doxepin (SINEQUAN) 10 MG capsule, Take 10 mg by mouth at bedtime., Disp: , Rfl:  ?  fluconazole (DIFLUCAN) 150 MG tablet, Take 150 mg by mouth once., Disp: , Rfl:  ?  ibuprofen (ADVIL) 600 MG tablet, ibuprofen 600 mg tablet  Take 1 tablet 3 times a day by oral route., Disp: , Rfl:  ?  mirtazapine (REMERON SOL-TAB) 30 MG disintegrating tablet, Take 1 tablet (30 mg total) by mouth at bedtime., Disp: 90 tablet, Rfl: 0 ?  oxyCODONE-acetaminophen (PERCOCET) 7.5-325 MG tablet, TAKE 1 TABLET BY MOUTH TWICE A DAY FOR TOTAL DOSE OF 7.5/5/7.5/5. TO FILL 01-15-16., Disp: , Rfl: 0 ?  Tdap (BOOSTRIX) 5-2.5-18.5 LF-MCG/0.5 injection, Inject 0.5 mLs into the muscle once. (Patient not taking: Reported on 10/15/2021), Disp: , Rfl:  ?  triamcinolone cream (KENALOG) 0.1 %, Apply 1 application topically 2 (two) times daily. Apply a thin layer to areas bid, Disp: 30 g, Rfl: 0 ?  zolpidem (AMBIEN) 10 MG tablet, Take 0.5-1 tablets (5-10 mg total) by mouth at bedtime as needed for sleep. Start with 1/2 tab first., Disp: 30 tablet, Rfl: 1 ? ?Observations/Objective: ?Patient is well-developed, well-nourished in no acute distress.  ?Resting comfortably  at home.  ?Head is normocephalic, atraumatic.  ?No labored breathing.  ?Speech is clear and coherent with logical content.  ?Patient is alert and oriented at baseline.   ?Patient holding tissue to nose with blood visible. ? ?Assessment and Plan: ? ?Phyllis Murphy in today with chief complaint of Epistaxis ? ? ?1. Epistaxis ?Ice  to nose while head down while holding pressure ?Ok to continue afrin as indicated - but do not use more then 2 days ?If continues will need to go to urgent care for further cauterization. ? ? ? ? ?Follow Up Instructions: ?I discussed the assessment and treatment plan with the patient. The patient was provided an opportunity to ask questions and all were answered. The patient agreed with the plan and demonstrated an understanding of the instructions.  A copy of instructions were sent to the patient via MyChart. ? ?The patient was advised to call back or seek an in-person evaluation if the symptoms worsen or if the condition fails to improve as anticipated. ? ?Time:  ?I spent 12 minutes with the patient via telehealth  technology discussing the above problems/concerns.   ? ?Mary-Margaret Hassell Done, FNP ? ?

## 2021-11-13 NOTE — Patient Instructions (Signed)
Nosebleed, Adult A nosebleed is when blood comes out of the nose. Nosebleeds are common. Usually, they are not a sign of a serious condition. Nosebleeds can happen if a blood vessel in your nose starts to bleed or if the lining of your nose (mucous membrane) cracks. They are commonly caused by: Allergies. Colds. Picking your nose. Blowing your nose too hard. An injury from sticking an object into your nose or getting hit in the nose. Dry or cold air. Less common causes of nosebleeds include: Toxic fumes. Something abnormal in the nose or in the air-filled spaces in the bones of the face (sinuses). Growths in the nose, such as polyps. Blood thinners or conditions that cause blood to clot slowly. Certain illnesses or procedures that irritate or dry out the nasal passages. Follow these instructions at home: When you have a nosebleed:  Sit down and tilt your head slightly forward. Use a clean towel or tissue to pinch your nostrils under the bony part of your nose. After 5 minutes, let go of your nose and see if bleeding starts again. Do not release pressure before that time. If there is still bleeding, repeat the pinching and holding for 5 minutes or until the bleeding stops. Do not place tissues or gauze in the nose to stop the bleeding. Avoid lying down and avoid tilting your head backward. That may make blood collect in the throat and cause gagging or coughing. Use a nasal spray decongestant to help with a nosebleed as told by your health care provider. After a nosebleed: Avoid blowing your nose or sniffing for a number of hours. Avoid straining, lifting, or bending at the waist for several days. You may go back to other normal activities as you are able. If you are taking aspirin or blood thinners and you have nosebleeds, talk to your health care provider. These medicines make bleeding more likely. Ask your health care provider if you should stop taking the medicines or if you should  adjust the dose. Do not stop taking medicines that your health care provider has recommended unless he or she tells you to stop taking them. If your nosebleed was caused by dry mucous membranes, use over-the-counter saline nasal spray or gel and a humidifier as told by your health care provider. This will keep the mucous membranes moist and allow them to heal. If you need to use one of these products: Choose one that is water-soluble. Use only as much as you need and use it only as often as needed. Do not lie down right after you use it. If you get nosebleeds often, talk with your health care provider about medical treatments. Options may include: Nasal cautery. This treatment stops and prevents nosebleeds by using a chemical swab or electrical device to lightly burn tiny blood vessels inside the nose. Nasal packing. A gauze or other material is placed in the nose to keep constant pressure on the bleeding area. Contact a health care provider if you: Have a fever. Get nosebleeds often or more often than usual. Bruise very easily. Have a nosebleed from having something stuck in your nose. Have bleeding in your mouth. Vomit or cough up brown material. Have a nosebleed after you start a new medicine. Get help right away if: You have a nosebleed after a fall or a head injury. Your nosebleed does not go away after 20 minutes. You feel dizzy or weak. You have unusual bleeding from other parts of your body. You have unusual bruising on   other parts of your body. You become sweaty. You vomit blood. Summary A nosebleed is when blood comes out of the nose. Common causes include allergies, an injury to the nose, or cold or dry air. Initial treatment includes applying pressure for 5 minutes. Moisturizing the nose with saline nasal spray or gel after a nosebleed may help prevent future bleeding. Get help right away if your nosebleed does not go away after 20 minutes. This information is not intended  to replace advice given to you by your health care provider. Make sure you discuss any questions you have with your health care provider. Document Revised: 06/06/2019 Document Reviewed: 06/06/2019 Elsevier Patient Education  2022 Elsevier Inc.  

## 2021-11-19 ENCOUNTER — Inpatient Hospital Stay: Payer: PPO

## 2021-11-19 VITALS — BP 138/90 | HR 89 | Temp 97.8°F

## 2021-11-19 DIAGNOSIS — D509 Iron deficiency anemia, unspecified: Secondary | ICD-10-CM | POA: Diagnosis not present

## 2021-11-19 DIAGNOSIS — D508 Other iron deficiency anemias: Secondary | ICD-10-CM

## 2021-11-19 MED ORDER — IRON SUCROSE 20 MG/ML IV SOLN
200.0000 mg | Freq: Once | INTRAVENOUS | Status: AC
  Administered 2021-11-19: 200 mg via INTRAVENOUS

## 2021-11-19 MED ORDER — SODIUM CHLORIDE 0.9 % IV SOLN
Freq: Once | INTRAVENOUS | Status: AC
  Filled 2021-11-19: qty 250

## 2021-11-19 MED ORDER — SODIUM CHLORIDE 0.9 % IV SOLN: 200.0000 mg | Freq: Once | INTRAVENOUS | Status: DC

## 2021-11-26 ENCOUNTER — Inpatient Hospital Stay: Payer: PPO | Attending: Oncology

## 2021-11-26 VITALS — BP 111/84 | HR 95

## 2021-11-26 DIAGNOSIS — D509 Iron deficiency anemia, unspecified: Secondary | ICD-10-CM | POA: Diagnosis present

## 2021-11-26 DIAGNOSIS — D508 Other iron deficiency anemias: Secondary | ICD-10-CM

## 2021-11-26 MED ORDER — IRON SUCROSE 20 MG/ML IV SOLN
200.0000 mg | Freq: Once | INTRAVENOUS | Status: AC
  Administered 2021-11-26: 200 mg via INTRAVENOUS
  Filled 2021-11-26: qty 10

## 2021-11-26 MED ORDER — SODIUM CHLORIDE 0.9 % IV SOLN: 200.0000 mg | Freq: Once | INTRAVENOUS | Status: DC

## 2021-11-26 MED ORDER — SODIUM CHLORIDE 0.9 % IV SOLN
Freq: Once | INTRAVENOUS | Status: AC
  Filled 2021-11-26: qty 250

## 2021-11-30 ENCOUNTER — Encounter: Payer: Self-pay | Admitting: Nurse Practitioner

## 2021-11-30 ENCOUNTER — Ambulatory Visit (INDEPENDENT_AMBULATORY_CARE_PROVIDER_SITE_OTHER): Payer: PPO | Admitting: Nurse Practitioner

## 2021-11-30 VITALS — BP 125/85 | HR 105 | Temp 97.6°F | Resp 16 | Ht 64.0 in | Wt 119.0 lb

## 2021-11-30 DIAGNOSIS — D649 Anemia, unspecified: Secondary | ICD-10-CM | POA: Diagnosis not present

## 2021-11-30 DIAGNOSIS — F458 Other somatoform disorders: Secondary | ICD-10-CM

## 2021-11-30 DIAGNOSIS — G4700 Insomnia, unspecified: Secondary | ICD-10-CM | POA: Diagnosis not present

## 2021-11-30 DIAGNOSIS — B360 Pityriasis versicolor: Secondary | ICD-10-CM | POA: Diagnosis not present

## 2021-11-30 DIAGNOSIS — F410 Panic disorder [episodic paroxysmal anxiety] without agoraphobia: Secondary | ICD-10-CM

## 2021-11-30 DIAGNOSIS — F411 Generalized anxiety disorder: Secondary | ICD-10-CM

## 2021-11-30 MED ORDER — ZOLPIDEM TARTRATE 10 MG PO TABS: 10.0000 mg | ORAL_TABLET | Freq: Every evening | ORAL | 2 refills | Status: DC | PRN

## 2021-11-30 MED ORDER — MIRTAZAPINE 30 MG PO TBDP: 30.0000 mg | ORAL_TABLET | Freq: Every day | ORAL | 1 refills | Status: DC

## 2021-11-30 NOTE — Progress Notes (Signed)
[facility] ?[address] ?[place], [place] [id number] ? ?Internal MEDICINE  ?Office Visit Note ? ?Patient Name: Phyllis ChromanDavona L Tramontana ? [id number]  ?[id number] ? ?Date of Service: 11/30/2021 ? ?Chief Complaint  ?Patient presents with  ? Follow-up  ? Hypertension  ? ? ?HPI ?Zarai presents for a follow up visit for hypertension, insomnia and nosebleeds. Her blood pressure is stable since increasing her BP medication. Has been taking ambien and mirtazapine for sleep which is helping.  ?She has had some nosebleeds, has had her nosebleeds cauterized. She has saline nasal spray ?and afrin nasal spray.  ?Per patient, her tinea versicolor has come back and she needs ketoconazole. ? ? ?Current Medication: ?Outpatient Encounter Medications as of 11/30/2021  ?Medication Sig Note  ? amLODipine (NORVASC) 10 MG tablet Take 1 tablet (10 mg total) by mouth daily.   ? colchicine 0.6 MG tablet Take 1 tablet (0.6 mg total) by mouth 2 (two) times daily.   ? cyclobenzaprine (FLEXERIL) 10 MG tablet Take 10 mg by mouth 4 (four) times daily.  04/07/2015: Received from: External Pharmacy  ? diphenhydrAMINE (BENADRYL) 25 mg capsule Take 25 mg by mouth every 6 (six) hours as needed.   ? doxepin (SINEQUAN) 10 MG capsule Take 10 mg by mouth at bedtime.   ? fluconazole (DIFLUCAN) 150 MG tablet Take 150 mg by mouth once.   ? ibuprofen (ADVIL) 600 MG tablet ibuprofen 600 mg tablet ? Take 1 tablet 3 times a day by oral route.   ? oxyCODONE-acetaminophen (PERCOCET) 7.5-325 MG tablet TAKE 1 TABLET BY MOUTH TWICE A DAY FOR TOTAL DOSE OF 7.5/5/7.5/5. TO FILL 01-15-16. 01/21/2016: Received from: External Pharmacy  ? Tdap (BOOSTRIX) 5-2.5-18.5 LF-MCG/0.5 injection Inject 0.5 mLs into the muscle once.   ? triamcinolone cream (KENALOG) 0.1 % Apply 1 application topically 2 (two) times daily. Apply a thin layer to areas bid   ? [DISCONTINUED] mirtazapine (REMERON SOL-TAB) 30 MG disintegrating tablet Take 1 tablet (30 mg total) by mouth at bedtime.   ?  [DISCONTINUED] zolpidem (AMBIEN) 10 MG tablet Take 0.5-1 tablets (5-10 mg total) by mouth at bedtime as needed for sleep. Start with 1/2 tab first.   ? ketoconazole (NIZORAL) 200 MG tablet Take 1 tablet (200 mg total) by mouth daily.   ? mirtazapine (REMERON SOL-TAB) 30 MG disintegrating tablet Take 1 tablet (30 mg total) by mouth at bedtime.   ? zolpidem (AMBIEN) 10 MG tablet Take 1 tablet (10 mg total) by mouth at bedtime as needed for sleep.   ? ?No facility-administered encounter medications on file as of 11/30/2021.  ? ? ?Surgical History: ?Past Surgical History:  ?Procedure Laterality Date  ? ABDOMINAL HYSTERECTOMY    ? AUGMENTATION MAMMAPLASTY Bilateral 2004  ? BREAST ENHANCEMENT SURGERY    ? CERVICAL FUSION    ? ELBOW SURGERY    ? two surgeries 6 yrs ago 2017  ? trigger finger release surgery Right 09/30/2021  ? ? ?Medical History: ?Past Medical History:  ?Diagnosis Date  ? Anxiety   ? DDD (degenerative disc disease), cervical   ? History of breast augmentation   ? 2007  ? Hx of hysterectomy   ? Hypertension   ? IDA (iron deficiency anemia) 10/15/2021  ? ? ?Family History: ?Family History  ?Problem Relation Age of Onset  ? Brain cancer Mother   ? Hypertension Father   ? Diabetes Father   ? Stroke Father   ? Breast cancer Neg Hx   ? ? ?Social History  ? ?  Socioeconomic History  ? Marital status: Married  ?  Spouse name: Not on file  ? Number of children: Not on file  ? Years of education: Not on file  ? Highest education level: Not on file  ?Occupational History  ? Occupation: [employer] started 2019  ?Tobacco Use  ? Smoking status: Some Days  ?  Packs/day: 1.00  ?  Years: 35.00  ?  Pack years: 35.00  ?  Types: E-cigarettes, Cigarettes  ?  Start date: 08/22/1980  ?  Last attempt to quit: 11/20/2020  ?  Years since quitting: 1.0  ? Smokeless tobacco: Never  ? Tobacco comments:  ?  Vapes sometimes, pt reports no longer smokes cigarettes, but does vape some.   ?Vaping Use  ? Vaping Use: Every day  ? Devices: no nictonie   ?Substance and Sexual Activity  ? Alcohol use: No  ?  Alcohol/week: 0.0 standard drinks  ? Drug use: No  ? Sexual activity: Yes  ?Other Topics Concern  ? Not on file  ?Social History Narrative  ? ** Merged History Encounter **  ?    ? ?Social Determinants of Health  ? ?Financial Resource Strain: Not on file  ?Food Insecurity: Not on file  ?Transportation Needs: Not on file  ?Physical Activity: Not on file  ?Stress: Not on file  ?Social Connections: Not on file  ?Intimate Partner Violence: Not on file  ? ? ? ? ?Review of Systems  ?Constitutional:  Negative for chills, fatigue and unexpected weight change.  ?HENT:  Positive for nosebleeds and postnasal drip. Negative for congestion, rhinorrhea, sneezing and sore throat.   ?Eyes:  Negative for redness.  ?Respiratory:  Negative for cough, chest tightness and shortness of breath.   ?Cardiovascular:  Negative for chest pain and palpitations.  ?Gastrointestinal:  Negative for abdominal pain, constipation, diarrhea, nausea and vomiting.  ?Genitourinary:  Negative for dysuria and frequency.  ?Musculoskeletal:  Negative for arthralgias, back pain, joint swelling and neck pain.  ?Skin:  Positive for rash.  ?Neurological: Negative.  Negative for tremors and numbness.  ?Hematological:  Negative for adenopathy. Does not bruise/bleed easily.  ?Psychiatric/Behavioral:  Positive for sleep disturbance. Negative for behavioral problems (Depression) and suicidal ideas. The patient is not nervous/anxious.   ? ?Vital Signs: ?BP 125/85   Pulse (!) 105   Temp 97.6 ?F (36.4 ?C)   Resp 16   Ht 5\' 4"  (1.626 m)   Wt 119 lb (54 kg)   SpO2 98%   BMI 20.43 kg/m?  ? ? ?Physical Exam ?Vitals reviewed.  ?Constitutional:   ?   General: She is not in acute distress. ?   Appearance: Normal appearance. She is normal weight. She is not ill-appearing.  ?HENT:  ?   Head: Normocephalic and atraumatic.  ?Eyes:  ?   Pupils: Pupils are equal, round, and reactive to light.  ?Cardiovascular:  ?   Rate  and Rhythm: Normal rate and regular rhythm.  ?Pulmonary:  ?   Effort: Pulmonary effort is normal. No respiratory distress.  ?Neurological:  ?   Mental Status: She is alert and oriented to person, place, and time.  ?Psychiatric:     ?   Mood and Affect: Mood normal.     ?   Behavior: Behavior normal.  ? ? ? ? ? ?Assessment/Plan: ?1. Tinea versicolor ?Ketoconazole prescribed.  ?- ketoconazole (NIZORAL) 200 MG tablet; Take 1 tablet (200 mg total) by mouth daily.  Dispense: 3 tablet; Refill: 0 ? ?2. Mixed insomnia ?Ambien  refills ordered. ?- zolpidem (AMBIEN) 10 MG tablet; Take 1 tablet (10 mg total) by mouth at bedtime as needed for sleep.  Dispense: 30 tablet; Refill: 2 ? ?3. Anemia, unspecified type ?Followed by hematology ? ?4. Psychogenic pruritus ?Mirtazapine dose increased.  ?- mirtazapine (REMERON) 30 MG tablet; Take 1 tablet (30 mg total) by mouth at bedtime.  Dispense: 90 tablet; Refill: 1 ? ?5. Generalized anxiety disorder with panic attacks ?Continue mirtazapine as prescribed ? ? ?General Counseling: Ajai verbalizes understanding of the findings of todays visit and agrees with plan of treatment. I have discussed any further diagnostic evaluation that may be needed or ordered today. We also reviewed her medications today. she has been encouraged to call the office with any questions or concerns that should arise related to todays visit. ? ? ? ?No orders of the defined types were placed in this encounter. ? ? ?Meds ordered this encounter  ?Medications  ? mirtazapine (REMERON SOL-TAB) 30 MG disintegrating tablet  ?  Sig: Take 1 tablet (30 mg total) by mouth at bedtime.  ?  Dispense:  90 tablet  ?  Refill:  1  ? zolpidem (AMBIEN) 10 MG tablet  ?  Sig: Take 1 tablet (10 mg total) by mouth at bedtime as needed for sleep.  ?  Dispense:  30 tablet  ?  Refill:  2  ?  Discontinue alprazolam, fill zolpidem today  ? ketoconazole (NIZORAL) 200 MG tablet  ?  Sig: Take 1 tablet (200 mg total) by mouth daily.  ?   Dispense:  3 tablet  ?  Refill:  0  ? ? ?Return in about 3 months (around 03/01/2022) for F/U, med refill, Ashelyn Mccravy PCP. ? ? ?Total time spent:30 Minutes ?Time spent includes review of chart, medications, test results, and follow

## 2021-12-01 ENCOUNTER — Encounter: Payer: Self-pay | Admitting: Nurse Practitioner

## 2021-12-01 MED ORDER — KETOCONAZOLE 200 MG PO TABS: 200.0000 mg | ORAL_TABLET | Freq: Every day | ORAL | 0 refills | Status: DC

## 2021-12-03 ENCOUNTER — Encounter: Payer: Self-pay | Admitting: Nurse Practitioner

## 2021-12-05 ENCOUNTER — Encounter: Payer: Self-pay | Admitting: Nurse Practitioner

## 2021-12-05 MED ORDER — MIRTAZAPINE 30 MG PO TABS: 30.0000 mg | ORAL_TABLET | Freq: Every day | ORAL | 1 refills | Status: DC

## 2021-12-14 ENCOUNTER — Encounter: Payer: Self-pay | Admitting: Nurse Practitioner

## 2021-12-15 NOTE — Telephone Encounter (Signed)
Spoke to pt and advised that with her having a dermatologist that she should call and get appt with them to be able to check with microscope.  Pt agreed to call dermatology. ?

## 2021-12-21 ENCOUNTER — Other Ambulatory Visit: Payer: Self-pay | Admitting: Nurse Practitioner

## 2021-12-21 DIAGNOSIS — Z1231 Encounter for screening mammogram for malignant neoplasm of breast: Secondary | ICD-10-CM

## 2021-12-22 ENCOUNTER — Ambulatory Visit: Payer: PPO | Admitting: Nurse Practitioner

## 2022-01-10 ENCOUNTER — Encounter: Payer: Self-pay | Admitting: Nurse Practitioner

## 2022-01-12 ENCOUNTER — Inpatient Hospital Stay: Payer: PPO | Attending: Oncology

## 2022-01-12 DIAGNOSIS — Z808 Family history of malignant neoplasm of other organs or systems: Secondary | ICD-10-CM | POA: Diagnosis not present

## 2022-01-12 DIAGNOSIS — Z90710 Acquired absence of both cervix and uterus: Secondary | ICD-10-CM | POA: Insufficient documentation

## 2022-01-12 DIAGNOSIS — D508 Other iron deficiency anemias: Secondary | ICD-10-CM | POA: Diagnosis not present

## 2022-01-12 DIAGNOSIS — D509 Iron deficiency anemia, unspecified: Secondary | ICD-10-CM

## 2022-01-12 DIAGNOSIS — I10 Essential (primary) hypertension: Secondary | ICD-10-CM | POA: Diagnosis not present

## 2022-01-12 DIAGNOSIS — F17210 Nicotine dependence, cigarettes, uncomplicated: Secondary | ICD-10-CM | POA: Insufficient documentation

## 2022-01-12 LAB — FERRITIN: Ferritin: 71 ng/mL (ref 11–307)

## 2022-01-12 LAB — IRON AND TIBC
Iron: 77 ug/dL (ref 28–170)
Saturation Ratios: 23 % (ref 10.4–31.8)
TIBC: 333 ug/dL (ref 250–450)
UIBC: 256 ug/dL

## 2022-01-12 LAB — CBC WITH DIFFERENTIAL/PLATELET
Abs Immature Granulocytes: 0.02 10*3/uL (ref 0.00–0.07)
Basophils Absolute: 0 10*3/uL (ref 0.0–0.1)
Basophils Relative: 1 %
Eosinophils Absolute: 0.1 10*3/uL (ref 0.0–0.5)
Eosinophils Relative: 1 %
HCT: 38.8 % (ref 36.0–46.0)
Hemoglobin: 12.2 g/dL (ref 12.0–15.0)
Immature Granulocytes: 0 %
Lymphocytes Relative: 29 %
Lymphs Abs: 1.6 10*3/uL (ref 0.7–4.0)
MCH: 26.9 pg (ref 26.0–34.0)
MCHC: 31.4 g/dL (ref 30.0–36.0)
MCV: 85.5 fL (ref 80.0–100.0)
Monocytes Absolute: 0.4 10*3/uL (ref 0.1–1.0)
Monocytes Relative: 7 %
Neutro Abs: 3.2 10*3/uL (ref 1.7–7.7)
Neutrophils Relative %: 62 %
Platelets: 300 10*3/uL (ref 150–400)
RBC: 4.54 MIL/uL (ref 3.87–5.11)
RDW: 22.4 % — ABNORMAL HIGH (ref 11.5–15.5)
WBC: 5.3 10*3/uL (ref 4.0–10.5)
nRBC: 0 % (ref 0.0–0.2)

## 2022-01-13 ENCOUNTER — Ambulatory Visit
Admission: RE | Admit: 2022-01-13 | Discharge: 2022-01-13 | Disposition: A | Discharge status: Home / Self Care | Payer: PPO | Source: Ambulatory Visit | Attending: Nurse Practitioner | Admitting: Nurse Practitioner

## 2022-01-13 ENCOUNTER — Encounter: Payer: Self-pay | Admitting: Nurse Practitioner

## 2022-01-13 ENCOUNTER — Telehealth: Payer: PPO | Admitting: Nurse Practitioner

## 2022-01-13 DIAGNOSIS — L219 Seborrheic dermatitis, unspecified: Secondary | ICD-10-CM | POA: Diagnosis not present

## 2022-01-13 DIAGNOSIS — Z1231 Encounter for screening mammogram for malignant neoplasm of breast: Secondary | ICD-10-CM | POA: Insufficient documentation

## 2022-01-13 DIAGNOSIS — J019 Acute sinusitis, unspecified: Secondary | ICD-10-CM

## 2022-01-13 MED ORDER — AMOXICILLIN-POT CLAVULANATE 875-125 MG PO TABS: 1.0000 | ORAL_TABLET | Freq: Two times a day (BID) | ORAL | 0 refills | Status: DC

## 2022-01-13 MED ORDER — TRIAMCINOLONE ACETONIDE 0.5 % EX CREA: TOPICAL_CREAM | Freq: Two times a day (BID) | CUTANEOUS | 0 refills | Status: DC | PRN

## 2022-01-13 NOTE — Patient Instructions (Signed)
Phyllis Murphy, thank you for joining Abran Cantor, NP for today's virtual visit.  While this provider is not your primary care provider (PCP), if your PCP is located in our provider database this encounter information will be shared with them immediately following your visit.  Consent: (Patient) Phyllis Murphy provided verbal consent for this virtual visit at the beginning of the encounter.  Current Medications:  Current Outpatient Medications:    amoxicillin-clavulanate (AUGMENTIN) 875-125 MG tablet, Take 1 tablet by mouth 2 (two) times daily., Disp: 20 tablet, Rfl: 0   triamcinolone 0.5%-Eucerin equivalent 1:1 cream mixture, Apply topically 2 (two) times daily as needed., Disp: 90 g, Rfl: 0   amLODipine (NORVASC) 10 MG tablet, Take 1 tablet (10 mg total) by mouth daily., Disp: 90 tablet, Rfl: 1   colchicine 0.6 MG tablet, Take 1 tablet (0.6 mg total) by mouth 2 (two) times daily., Disp: 30 tablet, Rfl: 0   cyclobenzaprine (FLEXERIL) 10 MG tablet, Take 10 mg by mouth 4 (four) times daily. , Disp: , Rfl: 2   diphenhydrAMINE (BENADRYL) 25 mg capsule, Take 25 mg by mouth every 6 (six) hours as needed., Disp: , Rfl:    doxepin (SINEQUAN) 10 MG capsule, Take 10 mg by mouth at bedtime., Disp: , Rfl:    fluconazole (DIFLUCAN) 150 MG tablet, Take 150 mg by mouth once., Disp: , Rfl:    ibuprofen (ADVIL) 600 MG tablet, ibuprofen 600 mg tablet  Take 1 tablet 3 times a day by oral route., Disp: , Rfl:    ketoconazole (NIZORAL) 200 MG tablet, Take 1 tablet (200 mg total) by mouth daily., Disp: 3 tablet, Rfl: 0   mirtazapine (REMERON) 30 MG tablet, Take 1 tablet (30 mg total) by mouth at bedtime., Disp: 90 tablet, Rfl: 1   oxyCODONE-acetaminophen (PERCOCET) 7.5-325 MG tablet, TAKE 1 TABLET BY MOUTH TWICE A DAY FOR TOTAL DOSE OF 7.5/5/7.5/5. TO FILL 01-15-16., Disp: , Rfl: 0   Tdap (BOOSTRIX) 5-2.5-18.5 LF-MCG/0.5 injection, Inject 0.5 mLs into the muscle once., Disp: , Rfl:     triamcinolone cream (KENALOG) 0.1 %, Apply 1 application topically 2 (two) times daily. Apply a thin layer to areas bid, Disp: 30 g, Rfl: 0   zolpidem (AMBIEN) 10 MG tablet, Take 1 tablet (10 mg total) by mouth at bedtime as needed for sleep., Disp: 30 tablet, Rfl: 2   Medications ordered in this encounter:  Meds ordered this encounter  Medications   amoxicillin-clavulanate (AUGMENTIN) 875-125 MG tablet    Sig: Take 1 tablet by mouth 2 (two) times daily.    Dispense:  20 tablet    Refill:  0   triamcinolone 0.5%-Eucerin equivalent 1:1 cream mixture    Sig: Apply topically 2 (two) times daily as needed.    Dispense:  90 g    Refill:  0     *If you need refills on other medications prior to your next appointment, please contact your pharmacy*  Follow-Up: Call back or seek an in-person evaluation if the symptoms worsen or if the condition fails to improve as anticipated.  Other Instructions  Take medication as prescribed. Supportive to include increasing fluids and getting plenty of rest. May use normal saline nasal spray to help with sinus symptoms. Continue to follow-up with dermatology to see if your appointment date can be moved up. Follow up with PCP if symptoms do not improve.     If you have been instructed to have an in-person evaluation today at a local Urgent  Care facility, please use the link below. It will take you to a list of all of our available [facility], including address, phone number and hours of operation. Please do not delay care.  [facility]  If you or a family member do not have a primary care provider, use the link below to schedule a visit and establish care. When you choose a [facility] primary care physician or advanced practice provider, you gain a long-term partner in health. Find a Primary Care Provider  Learn more about [facility]'s in-office and virtual care options: [facility] - Get Care Now

## 2022-01-13 NOTE — Progress Notes (Signed)
Virtual Visit Consent   Phyllis Murphy, you are scheduled for a virtual visit with a [facility] provider today. Just as with appointments in the office, your consent must be obtained to participate. Your consent will be active for this visit and any virtual visit you may have with one of our providers in the next 365 days. If you have a MyChart account, a copy of this consent can be sent to you electronically.  As this is a virtual visit, video technology does not allow for your provider to perform a traditional examination. This may limit your provider's ability to fully assess your condition. If your provider identifies any concerns that need to be evaluated in person or the need to arrange testing (such as labs, EKG, etc.), we will make arrangements to do so. Although advances in technology are sophisticated, we cannot ensure that it will always work on either your end or our end. If the connection with a video visit is poor, the visit may have to be switched to a telephone visit. With either a video or telephone visit, we are not always able to ensure that we have a secure connection.  By engaging in this virtual visit, you consent to the provision of healthcare and authorize for your insurance to be billed (if applicable) for the services provided during this visit. Depending on your insurance coverage, you may receive a charge related to this service.  I need to obtain your verbal consent now. Are you willing to proceed with your visit today? Phyllis Murphy has provided verbal consent on 01/13/2022 for a virtual visit (video or telephone). Abran Cantorhristie J Jazzmyn Filion-Warren, NP  Date: 01/13/2022 4:58 PM  Virtual Visit via Video Note   I, Sadie Haberhristie J Shawnta Schlegel-Warren, connected with  Phyllis Murphy  ([id number], Jan 12, 1966) on 01/13/22 at  4:45 PM EDT by a video-enabled telemedicine application and verified that I am speaking with the correct person using two identifiers.  Location: Patient: Virtual  Visit Location Patient: Home Provider: Virtual Visit Location Provider: Home   I discussed the limitations of evaluation and management by telemedicine and the availability of in person appointments. The patient expressed understanding and agreed to proceed.    History of Present Illness: Phyllis Murphy is a 56 y.o. who identifies as a female who was assigned female at birth, and is being seen today for upper respiratory symptoms. Symptoms have been present for the past 8 days, have worsened over the past 2 days. She complains of  headache, bilateral ear fullness, nasal congestion, runny nose, postnasal drainage. She denies fever, chills, cough, decreased appetite, n/v/d.   Patient also has a history of seborrheic dermatitis. States [facility], but is having difficulty getting an appointment, she has one scheduled in 2 months. Complains of skin dryness, scaling and itching. Was previously prescribed triamcinolone/cereve.  HPI: HPI Problems:  Patient Active Problem List   Diagnosis Date Noted   IDA (iron deficiency anemia) 10/15/2021   Generalized anxiety disorder with panic attacks 02/06/2021   Idiopathic gout 11/21/2018   Drug-induced constipation 11/14/2018   Ganglion cyst of dorsum of left wrist 06/20/2018   Mixed incontinence urge and stress 06/20/2018   Tinea versicolor 06/20/2018   Mixed hyperlipidemia 07/13/2015   Tobacco abuse 07/13/2015   Cervical spinal stenosis 04/04/2012   Essential hypertension 04/04/2012   Back ache 02/03/2012   Hand numbness 02/03/2012    Allergies:  Allergies  Allergen Reactions   Esmolol Anaphylaxis  Latex Anaphylaxis   Bacitracin Hives and Itching   Other Hives    Dermabond   Sulfa Antibiotics Hives and Itching   Medications:  Current Outpatient Medications:    amoxicillin-clavulanate (AUGMENTIN) 875-125 MG tablet, Take 1 tablet by mouth 2 (two) times daily., Disp: 20 tablet, Rfl: 0   triamcinolone  0.5%-Eucerin equivalent 1:1 cream mixture, Apply topically 2 (two) times daily as needed., Disp: 90 g, Rfl: 0   amLODipine (NORVASC) 10 MG tablet, Take 1 tablet (10 mg total) by mouth daily., Disp: 90 tablet, Rfl: 1   colchicine 0.6 MG tablet, Take 1 tablet (0.6 mg total) by mouth 2 (two) times daily., Disp: 30 tablet, Rfl: 0   cyclobenzaprine (FLEXERIL) 10 MG tablet, Take 10 mg by mouth 4 (four) times daily. , Disp: , Rfl: 2   diphenhydrAMINE (BENADRYL) 25 mg capsule, Take 25 mg by mouth every 6 (six) hours as needed., Disp: , Rfl:    doxepin (SINEQUAN) 10 MG capsule, Take 10 mg by mouth at bedtime., Disp: , Rfl:    fluconazole (DIFLUCAN) 150 MG tablet, Take 150 mg by mouth once., Disp: , Rfl:    ibuprofen (ADVIL) 600 MG tablet, ibuprofen 600 mg tablet  Take 1 tablet 3 times a day by oral route., Disp: , Rfl:    ketoconazole (NIZORAL) 200 MG tablet, Take 1 tablet (200 mg total) by mouth daily., Disp: 3 tablet, Rfl: 0   mirtazapine (REMERON) 30 MG tablet, Take 1 tablet (30 mg total) by mouth at bedtime., Disp: 90 tablet, Rfl: 1   oxyCODONE-acetaminophen (PERCOCET) 7.5-325 MG tablet, TAKE 1 TABLET BY MOUTH TWICE A DAY FOR TOTAL DOSE OF 7.5/5/7.5/5. TO FILL 01-15-16., Disp: , Rfl: 0   Tdap (BOOSTRIX) 5-2.5-18.5 LF-MCG/0.5 injection, Inject 0.5 mLs into the muscle once., Disp: , Rfl:    triamcinolone cream (KENALOG) 0.1 %, Apply 1 application topically 2 (two) times daily. Apply a thin layer to areas bid, Disp: 30 g, Rfl: 0   zolpidem (AMBIEN) 10 MG tablet, Take 1 tablet (10 mg total) by mouth at bedtime as needed for sleep., Disp: 30 tablet, Rfl: 2  Observations/Objective: Patient is well-developed, well-nourished in no acute distress.  Resting comfortably at home.  Head is normocephalic, atraumatic.  No labored breathing.  Speech is clear and coherent with logical content.  Patient is alert and oriented at baseline.    Assessment and Plan: 1. Acute sinusitis, recurrence not specified,  unspecified location - amoxicillin-clavulanate (AUGMENTIN) 875-125 MG tablet; Take 1 tablet by mouth 2 (two) times daily.  Dispense: 20 tablet; Refill: 0  2. Seborrheic dermatitis, unspecified - triamcinolone 0.5%-Eucerin equivalent 1:1 cream mixture; Apply topically 2 (two) times daily as needed.  Dispense: 90 g; Refill: 0  Take medication as prescribed. Supportive to include increasing fluids and getting plenty of rest. May use normal saline nasal spray to help with sinus symptoms. Continue to follow-up with dermatology to see if your appointment date can be moved up. Follow up with PCP if symptoms do not improve.    Follow Up Instructions: I discussed the assessment and treatment plan with the patient. The patient was provided an opportunity to ask questions and all were answered. The patient agreed with the plan and demonstrated an understanding of the instructions.  A copy of instructions were sent to the patient via MyChart unless otherwise noted below.   The patient was advised to call back or seek an in-person evaluation if the symptoms worsen or if the condition fails to improve as  anticipated.  Time:  I spent 15 minutes with the patient via telehealth technology discussing the above problems/concerns.    Abran Cantor, NP

## 2022-01-14 ENCOUNTER — Inpatient Hospital Stay: Payer: PPO | Admitting: Oncology

## 2022-01-14 ENCOUNTER — Inpatient Hospital Stay: Payer: PPO

## 2022-01-14 ENCOUNTER — Encounter: Payer: Self-pay | Admitting: Oncology

## 2022-01-14 VITALS — BP 117/82 | HR 95 | Temp 97.5°F | Resp 18 | Wt 118.4 lb

## 2022-01-14 DIAGNOSIS — D508 Other iron deficiency anemias: Secondary | ICD-10-CM | POA: Diagnosis not present

## 2022-01-14 NOTE — Progress Notes (Signed)
Pt here for follow up and venofer infusion. Pt reports she has a sinus infection and will start antibiotic today after she picks it up.

## 2022-01-15 ENCOUNTER — Encounter: Payer: Self-pay | Admitting: Oncology

## 2022-01-15 NOTE — Progress Notes (Signed)
Hematology/Oncology Consult note [phone] [phone]         Patient Care Team: Jonetta Osgood, NP as PCP - General (Nurse Practitioner)  REFERRING PROVIDER: Jonetta Osgood, NP  CHIEF COMPLAINTS/REASON FOR VISIT:  iron deficiency anemia  HISTORY OF PRESENTING ILLNESS:   Phyllis Murphy is a  56 y.o.  female with PMH listed below was seen in consultation at the request of  Jonetta Osgood, NP  for evaluation of iron deficiency anemia Patient reports longstanding history of iron deficiency anemia. 09/27/2021, CBC showed a hemoglobin of 8.0, MCV 68, platelet count 517. Iron panel showed ferritin of 9, iron saturation 3, TIBC 567. She reports that she has previously tried oral iron supplementation and not able to tolerate She has a history of hysterectomy.  Denies any black tarry stool or blood in the stool. She has never had a colonoscopy.  Previously she has had Cologuard done. Denies unintentional weight loss, fever, night sweats.  She reports feeling tired and fatigued.  INTERVAL HISTORY Phyllis Murphy is a 57 y.o. female who has above history reviewed by me today presents for follow up visit for deficiency anemia.  Patient tolerated IV Venofer treatments.  No side effects. He feels better today.  Fatigue has improved.   Review of Systems  Constitutional:  Positive for fatigue. Negative for appetite change, chills and fever.  HENT:   Negative for hearing loss and voice change.   Eyes:  Negative for eye problems.  Respiratory:  Negative for chest tightness and cough.   Cardiovascular:  Negative for chest pain.  Gastrointestinal:  Negative for abdominal distention, abdominal pain and blood in stool.  Endocrine: Negative for hot flashes.  Genitourinary:  Negative for difficulty urinating and frequency.   Musculoskeletal:  Negative for arthralgias.  Skin:  Negative for itching and rash.  Neurological:  Negative for extremity weakness.   Hematological:  Negative for adenopathy.  Psychiatric/Behavioral:  Negative for confusion.    MEDICAL HISTORY:  Past Medical History:  Diagnosis Date   Anxiety    DDD (degenerative disc disease), cervical    History of breast augmentation    2007   Hx of hysterectomy    Hypertension    IDA (iron deficiency anemia) 10/15/2021    SURGICAL HISTORY: Past Surgical History:  Procedure Laterality Date   ABDOMINAL HYSTERECTOMY     AUGMENTATION MAMMAPLASTY Bilateral 2004   BREAST ENHANCEMENT SURGERY     CERVICAL FUSION     ELBOW SURGERY     two surgeries 6 yrs ago 2017   trigger finger release surgery Right 09/30/2021    SOCIAL HISTORY: Social History   Socioeconomic History   Marital status: Married    Spouse name: Not on file   Number of children: Not on file   Years of education: Not on file   Highest education level: Not on file  Occupational History   Occupation: [employer] started 2019  Tobacco Use   Smoking status: Some Days    Packs/day: 1.00    Years: 35.00    Pack years: 35.00    Types: E-cigarettes, Cigarettes    Start date: 08/22/1980    Last attempt to quit: 11/20/2020    Years since quitting: 1.1   Smokeless tobacco: Never   Tobacco comments:    Vapes sometimes, pt reports no longer smokes cigarettes, but does vape some.   Vaping Use   Vaping Use: Every day   Devices: no nictonie  Substance and Sexual Activity   Alcohol  use: No    Alcohol/week: 0.0 standard drinks   Drug use: No   Sexual activity: Yes  Other Topics Concern   Not on file  Social History Narrative   ** Merged History Encounter **       Social Determinants of Health   Financial Resource Strain: Not on file  Food Insecurity: Not on file  Transportation Needs: Not on file  Physical Activity: Not on file  Stress: Not on file  Social Connections: Not on file  Intimate Partner Violence: Not on file    FAMILY HISTORY: Family History  Problem Relation Age of Onset   Brain cancer Mother     Hypertension Father    Diabetes Father    Stroke Father    Breast cancer Neg Hx     ALLERGIES:  is allergic to esmolol, latex, bacitracin, other, and sulfa antibiotics.  MEDICATIONS:  Current Outpatient Medications  Medication Sig Dispense Refill   amLODipine (NORVASC) 10 MG tablet Take 1 tablet (10 mg total) by mouth daily. 90 tablet 1   cyclobenzaprine (FLEXERIL) 10 MG tablet Take 10 mg by mouth 4 (four) times daily.   2   diphenhydrAMINE (BENADRYL) 25 mg capsule Take 25 mg by mouth every 6 (six) hours as needed.     doxepin (SINEQUAN) 10 MG capsule Take 10 mg by mouth at bedtime.     ibuprofen (ADVIL) 600 MG tablet ibuprofen 600 mg tablet  Take 1 tablet 3 times a day by oral route.     mirtazapine (REMERON) 30 MG tablet Take 1 tablet (30 mg total) by mouth at bedtime. 90 tablet 1   oxyCODONE-acetaminophen (PERCOCET) 7.5-325 MG tablet TAKE 1 TABLET BY MOUTH TWICE A DAY FOR TOTAL DOSE OF 7.5/5/7.5/5. TO FILL 01-15-16.  0   zolpidem (AMBIEN) 10 MG tablet Take 1 tablet (10 mg total) by mouth at bedtime as needed for sleep. 30 tablet 2   amoxicillin-clavulanate (AUGMENTIN) 875-125 MG tablet Take 1 tablet by mouth 2 (two) times daily. (Patient not taking: Reported on 01/14/2022) 20 tablet 0   colchicine 0.6 MG tablet Take 1 tablet (0.6 mg total) by mouth 2 (two) times daily. (Patient not taking: Reported on 01/14/2022) 30 tablet 0   fluconazole (DIFLUCAN) 150 MG tablet Take 150 mg by mouth once. (Patient not taking: Reported on 01/14/2022)     ketoconazole (NIZORAL) 200 MG tablet Take 1 tablet (200 mg total) by mouth daily. (Patient not taking: Reported on 01/14/2022) 3 tablet 0   Tdap (BOOSTRIX) 5-2.5-18.5 LF-MCG/0.5 injection Inject 0.5 mLs into the muscle once. (Patient not taking: Reported on 01/14/2022)     triamcinolone 0.5%-Eucerin equivalent 1:1 cream mixture Apply topically 2 (two) times daily as needed. (Patient not taking: Reported on 01/14/2022) 90 g 0   triamcinolone cream (KENALOG)  0.1 % Apply 1 application topically 2 (two) times daily. Apply a thin layer to areas bid (Patient not taking: Reported on 01/14/2022) 30 g 0   No current facility-administered medications for this visit.     PHYSICAL EXAMINATION: ECOG PERFORMANCE STATUS: 0 - Asymptomatic Vitals:   01/14/22 1210  BP: 117/82  Pulse: 95  Resp: 18  Temp: (!) 97.5 F (36.4 C)   Filed Weights   01/14/22 1210  Weight: 118 lb 6.4 oz (53.7 kg)    Physical Exam Constitutional:      General: She is not in acute distress. HENT:     Head: Normocephalic and atraumatic.  Eyes:     General: No scleral icterus.  Cardiovascular:     Rate and Rhythm: Normal rate and regular rhythm.     Heart sounds: Normal heart sounds.  Pulmonary:     Effort: Pulmonary effort is normal. No respiratory distress.     Breath sounds: No wheezing.  Abdominal:     General: Bowel sounds are normal. There is no distension.     Palpations: Abdomen is soft.  Musculoskeletal:        General: No deformity. Normal range of motion.     Cervical back: Normal range of motion and neck supple.  Skin:    General: Skin is warm and dry.     Findings: No erythema or rash.  Neurological:     Mental Status: She is alert and oriented to person, place, and time. Mental status is at baseline.     Cranial Nerves: No cranial nerve deficit.     Coordination: Coordination normal.  Psychiatric:        Mood and Affect: Mood normal.    LABORATORY DATA:  I have reviewed the data as listed Lab Results  Component Value Date   WBC 5.3 01/12/2022   HGB 12.2 01/12/2022   HCT 38.8 01/12/2022   MCV 85.5 01/12/2022   PLT 300 01/12/2022   Recent Labs    05/03/21 1107  NA 133*  K 5.7*  CL 99  CO2 21  GLUCOSE 97  BUN 5*  CREATININE 0.81  CALCIUM 9.3  PROT 6.2  ALBUMIN 4.1  AST 12  ALT 8  ALKPHOS 80  BILITOT <0.2    Iron/TIBC/Ferritin/ %Sat    Component Value Date/Time   IRON 77 01/12/2022 1134   IRON 17 (L) 09/27/2021 1320   TIBC  333 01/12/2022 1134   TIBC 567 (HH) 09/27/2021 1320   FERRITIN 71 01/12/2022 1134   FERRITIN 9 (L) 09/27/2021 1320   IRONPCTSAT 23 01/12/2022 1134   IRONPCTSAT 3 (LL) 09/27/2021 1320       RADIOGRAPHIC STUDIES: I have personally reviewed the radiological images as listed and agreed with the findings in the report. MM 3D SCREEN BREAST W/IMPLANT BILATERAL  Result Date: 01/14/2022 CLINICAL DATA:  Screening. EXAM: DIGITAL SCREENING BILATERAL MAMMOGRAM WITH IMPLANTS, CAD AND TOMOSYNTHESIS TECHNIQUE: Bilateral screening digital craniocaudal and mediolateral oblique mammograms were obtained. Bilateral screening digital breast tomosynthesis was performed. The images were evaluated with computer-aided detection. Standard and/or implant displaced views were performed. COMPARISON:  Previous exam(s). ACR Breast Density Category c: The breast tissue is heterogeneously dense, which may obscure small masses. FINDINGS: The patient has retropectoral saline implants. There are no findings suspicious for malignancy. IMPRESSION: No mammographic evidence of malignancy. A result letter of this screening mammogram will be mailed directly to the patient. RECOMMENDATION: Screening mammogram in one year. (Code:SM-B-01Y) BI-RADS CATEGORY  1:  Negative. Electronically Signed   By: Meda Klinefelter M.D.   On: 01/14/2022 12:59      ASSESSMENT & PLAN:  1. Other iron deficiency anemia    Labs reviewed and discussed with patient. CBC has increased from 8-12.2, iron saturation 32, ferritin 71 Hold additional IV Venofer treatments for now.  Recommend patient to get gastroenterology work-up for iron deficiency anemia.  She will further discuss with PCP.  Orders Placed This Encounter  Procedures   CBC with Differential/Platelet    Standing Status:   Future    Standing Expiration Date:   01/15/2023   Ferritin    Standing Status:   Future    Standing Expiration Date:   01/15/2023  Iron and TIBC    Standing Status:    Future    Standing Expiration Date:   01/15/2023    All questions were answered. The patient knows to call the clinic with any problems questions or concerns.  cc Jonetta Osgood, NP    Follow-up in 6 months.  Earlie Server, MD, PhD [facility] Hematology Oncology 01/15/2022

## 2022-01-31 ENCOUNTER — Telehealth: Payer: Self-pay

## 2022-01-31 NOTE — Telephone Encounter (Signed)
Left vm and sent mychart message to confirm 02/07/22 appointment-Toni 

## 2022-02-07 ENCOUNTER — Encounter: Payer: Self-pay | Admitting: Nurse Practitioner

## 2022-02-07 ENCOUNTER — Ambulatory Visit (INDEPENDENT_AMBULATORY_CARE_PROVIDER_SITE_OTHER): Payer: PPO | Admitting: Nurse Practitioner

## 2022-02-07 VITALS — BP 137/90 | HR 100 | Temp 98.9°F | Resp 16 | Ht 64.0 in | Wt 119.2 lb

## 2022-02-07 DIAGNOSIS — F458 Other somatoform disorders: Secondary | ICD-10-CM

## 2022-02-07 DIAGNOSIS — B360 Pityriasis versicolor: Secondary | ICD-10-CM

## 2022-02-07 DIAGNOSIS — D508 Other iron deficiency anemias: Secondary | ICD-10-CM | POA: Diagnosis not present

## 2022-02-07 DIAGNOSIS — E782 Mixed hyperlipidemia: Secondary | ICD-10-CM

## 2022-02-07 DIAGNOSIS — G4700 Insomnia, unspecified: Secondary | ICD-10-CM

## 2022-02-07 DIAGNOSIS — I10 Essential (primary) hypertension: Secondary | ICD-10-CM | POA: Diagnosis not present

## 2022-02-07 DIAGNOSIS — Z0001 Encounter for general adult medical examination with abnormal findings: Secondary | ICD-10-CM | POA: Diagnosis not present

## 2022-02-07 DIAGNOSIS — F411 Generalized anxiety disorder: Secondary | ICD-10-CM

## 2022-02-07 DIAGNOSIS — F410 Panic disorder [episodic paroxysmal anxiety] without agoraphobia: Secondary | ICD-10-CM

## 2022-02-07 DIAGNOSIS — E538 Deficiency of other specified B group vitamins: Secondary | ICD-10-CM

## 2022-02-07 DIAGNOSIS — E559 Vitamin D deficiency, unspecified: Secondary | ICD-10-CM

## 2022-02-07 MED ORDER — MIRTAZAPINE 30 MG PO TABS: 30.0000 mg | ORAL_TABLET | Freq: Every day | ORAL | 3 refills | Status: DC

## 2022-02-07 MED ORDER — AMLODIPINE BESYLATE 10 MG PO TABS: 10.0000 mg | ORAL_TABLET | Freq: Every day | ORAL | 3 refills | Status: DC

## 2022-02-07 MED ORDER — ZOLPIDEM TARTRATE 10 MG PO TABS: 10.0000 mg | ORAL_TABLET | Freq: Every evening | ORAL | 2 refills | Status: DC | PRN

## 2022-02-07 MED ORDER — KETOCONAZOLE 200 MG PO TABS: 200.0000 mg | ORAL_TABLET | Freq: Every day | ORAL | 0 refills | Status: DC

## 2022-02-07 NOTE — Progress Notes (Cosign Needed)
[facility] [place], [place] [address]  Internal MEDICINE  Office Visit Note  Patient Name: Phyllis Murphy  [id number]  [id number]  Date of Service: 02/07/2022  Chief Complaint  Patient presents with   Annual Exam   Hypertension   Anxiety   Anemia    HPI Phyllis Murphy presents for an annual well visit and physical exam.  She is a well-appearing 56 year old female with hypertension, recurrent tinea versicolor, cervical spinal stenosis, hyperlipidemia, generalized anxiety disorder and iron deficiency anemia.  She reports that her anxiety is well managed at this time and she is in good spirits today.  She does keep ketoconazole tablets on hand in case the tenia versicolor comes back.  She has started seeing a new dermatologist at [facility] dermatology named Dr. Maudie Mercury and was instructed to stop taking the triamcinolone Eucerin topical mixture and was instructed to start taking Claritin or Zyrtec over-the-counter and she reports that she is taking Claritin.  She also saw her ENT and was started on the Astepro nasal spray.  She recently had her mammogram done and the result was BI-RADS Category 1 negative with the recommendation of repeating in 1 year. She is being followed by Dr. Tasia Catchings for hematology for iron deficiency anemia.  Her CBC and additional labs are stable at this time after having a course of iron infusions and she will be going back to have repeat labs done later this year. Annual labs that still need to be checked include her vitamin D level, lipid panel and CMP.  She also did the Cologuard stool test in August last year and that was negative. She continues to take Ambien for sleep at night which remains effective and she is in need of refills today.  Her blood pressure and other vital signs are stable, BMI and weight are within normal limits. She quit smoking cigarettes in April last year but admits that she does vape sometimes. She denies any alcohol or recreational  drug use.   Current Medication: Outpatient Encounter Medications as of 02/07/2022  Medication Sig Note   cyclobenzaprine (FLEXERIL) 10 MG tablet Take 10 mg by mouth 4 (four) times daily.  04/07/2015: Received from: External Pharmacy   diphenhydrAMINE (BENADRYL) 25 mg capsule Take 25 mg by mouth every 6 (six) hours as needed.    doxepin (SINEQUAN) 10 MG capsule Take 10 mg by mouth at bedtime.    ibuprofen (ADVIL) 600 MG tablet ibuprofen 600 mg tablet  Take 1 tablet 3 times a day by oral route.    oxyCODONE-acetaminophen (PERCOCET) 7.5-325 MG tablet TAKE 1 TABLET BY MOUTH TWICE A DAY FOR TOTAL DOSE OF 7.5/5/7.5/5. TO FILL 01-15-16. 01/21/2016: Received from: External Pharmacy   [DISCONTINUED] amLODipine (NORVASC) 10 MG tablet Take 1 tablet (10 mg total) by mouth daily.    [DISCONTINUED] ketoconazole (NIZORAL) 200 MG tablet Take 1 tablet (200 mg total) by mouth daily. (Patient taking differently: Take 200 mg by mouth daily. prn)    [DISCONTINUED] mirtazapine (REMERON) 30 MG tablet Take 1 tablet (30 mg total) by mouth at bedtime.    [DISCONTINUED] zolpidem (AMBIEN) 10 MG tablet Take 1 tablet (10 mg total) by mouth at bedtime as needed for sleep.    amLODipine (NORVASC) 10 MG tablet Take 1 tablet (10 mg total) by mouth daily.    ketoconazole (NIZORAL) 200 MG tablet Take 1 tablet (200 mg total) by mouth daily.    mirtazapine (REMERON) 30 MG tablet Take 1 tablet (30 mg total) by mouth  at bedtime.    zolpidem (AMBIEN) 10 MG tablet Take 1 tablet (10 mg total) by mouth at bedtime as needed for sleep.    [DISCONTINUED] amoxicillin-clavulanate (AUGMENTIN) 875-125 MG tablet Take 1 tablet by mouth 2 (two) times daily. (Patient not taking: Reported on 01/14/2022)    [DISCONTINUED] colchicine 0.6 MG tablet Take 1 tablet (0.6 mg total) by mouth 2 (two) times daily. (Patient not taking: Reported on 01/14/2022)    [DISCONTINUED] fluconazole (DIFLUCAN) 150 MG tablet Take 150 mg by mouth once. (Patient not taking:  Reported on 01/14/2022)    [DISCONTINUED] Tdap (BOOSTRIX) 5-2.5-18.5 LF-MCG/0.5 injection Inject 0.5 mLs into the muscle once. (Patient not taking: Reported on 01/14/2022)    [DISCONTINUED] triamcinolone 0.5%-Eucerin equivalent 1:1 cream mixture Apply topically 2 (two) times daily as needed. (Patient not taking: Reported on 01/14/2022)    [DISCONTINUED] triamcinolone cream (KENALOG) 0.1 % Apply 1 application topically 2 (two) times daily. Apply a thin layer to areas bid (Patient not taking: Reported on 01/14/2022)    No facility-administered encounter medications on file as of 02/07/2022.    Surgical History: Past Surgical History:  Procedure Laterality Date   ABDOMINAL HYSTERECTOMY     AUGMENTATION MAMMAPLASTY Bilateral 2004   BREAST ENHANCEMENT SURGERY     CERVICAL FUSION     ELBOW SURGERY     two surgeries 6 yrs ago 2017   trigger finger release surgery Right 09/30/2021    Medical History: Past Medical History:  Diagnosis Date   Anxiety    DDD (degenerative disc disease), cervical    History of breast augmentation    2007   Hx of hysterectomy    Hypertension    IDA (iron deficiency anemia) 10/15/2021    Family History: Family History  Problem Relation Age of Onset   Brain cancer Mother    Hypertension Father    Diabetes Father    Stroke Father    Breast cancer Neg Hx     Social History   Socioeconomic History   Marital status: Married    Spouse name: Not on file   Number of children: Not on file   Years of education: Not on file   Highest education level: Not on file  Occupational History   Occupation: [employer] started 2019  Tobacco Use   Smoking status: Some Days    Packs/day: 1.00    Years: 35.00    Total pack years: 35.00    Types: E-cigarettes, Cigarettes    Start date: 08/22/1980    Last attempt to quit: 11/20/2020    Years since quitting: 1.2   Smokeless tobacco: Never   Tobacco comments:    Vapes sometimes, pt reports no longer smokes cigarettes, but does  vape some.   Vaping Use   Vaping Use: Every day   Devices: no nictonie  Substance and Sexual Activity   Alcohol use: No    Alcohol/week: 0.0 standard drinks of alcohol   Drug use: No   Sexual activity: Yes  Other Topics Concern   Not on file  Social History Narrative   ** Merged History Encounter **       Social Determinants of Health   Financial Resource Strain: Not on file  Food Insecurity: Not on file  Transportation Needs: Not on file  Physical Activity: Not on file  Stress: Not on file  Social Connections: Not on file  Intimate Partner Violence: Not on file      Review of Systems  Constitutional:  Negative for activity change,  appetite change, chills, fatigue, fever and unexpected weight change.  HENT: Negative.  Negative for congestion, ear pain, rhinorrhea, sore throat and trouble swallowing.   Eyes: Negative.   Respiratory: Negative.  Negative for cough, chest tightness, shortness of breath and wheezing.   Cardiovascular: Negative.  Negative for chest pain.  Gastrointestinal: Negative.  Negative for abdominal pain, blood in stool, constipation, diarrhea, nausea and vomiting.  Endocrine: Negative.   Genitourinary: Negative.  Negative for difficulty urinating, dysuria, frequency, hematuria and urgency.  Musculoskeletal: Negative.  Negative for arthralgias, back pain, joint swelling, myalgias and neck pain.  Skin: Negative.  Negative for rash and wound.  Allergic/Immunologic: Negative.  Negative for immunocompromised state.  Neurological: Negative.  Negative for dizziness, seizures, numbness and headaches.  Hematological: Negative.   Psychiatric/Behavioral: Negative.  Negative for behavioral problems, self-injury and suicidal ideas. The patient is not nervous/anxious.     Vital Signs: BP 137/90   Pulse 100   Temp 98.9 F (37.2 C)   Resp 16   Ht _0  (1.626 m)   Wt 119 lb 3.2 oz (54.1 kg)   SpO2 97%   BMI 20.46 kg/m    Physical Exam Vitals reviewed.   Constitutional:      General: She is not in acute distress.    Appearance: Normal appearance. She is well-developed and normal weight. She is not ill-appearing or diaphoretic.  HENT:     Head: Normocephalic and atraumatic.     Right Ear: Tympanic membrane, ear canal and external ear normal.     Left Ear: Tympanic membrane, ear canal and external ear normal.     Nose: Nose normal. No congestion or rhinorrhea.     Mouth/Throat:     Mouth: Mucous membranes are moist.     Pharynx: Oropharynx is clear. No oropharyngeal exudate or posterior oropharyngeal erythema.  Eyes:     Extraocular Movements: Extraocular movements intact.     Conjunctiva/sclera: Conjunctivae normal.     Pupils: Pupils are equal, round, and reactive to light.  Neck:     Thyroid: No thyromegaly.     Vascular: No JVD.     Trachea: No tracheal deviation.  Cardiovascular:     Rate and Rhythm: Normal rate and regular rhythm.     Pulses: Normal pulses.     Heart sounds: Normal heart sounds, S1 normal and S2 normal. No murmur heard.    No friction rub. No gallop.  Pulmonary:     Effort: Pulmonary effort is normal. No accessory muscle usage or respiratory distress.     Breath sounds: Normal breath sounds and air entry. No decreased breath sounds, wheezing or rales.  Chest:     Chest wall: No tenderness.     Comments: Recently had mammogram, declined clinical breast exam. Abdominal:     General: Abdomen is flat. Bowel sounds are normal. There is no distension.     Palpations: Abdomen is soft.     Tenderness: There is no abdominal tenderness.  Musculoskeletal:        General: Normal range of motion.     Cervical back: Normal range of motion and neck supple.     Right lower leg: No edema.     Left lower leg: No edema.  Lymphadenopathy:     Cervical: No cervical adenopathy.  Skin:    General: Skin is warm and dry.     Capillary Refill: Capillary refill takes less than 2 seconds.     Coloration: Skin is not jaundiced.  Findings: No bruising.  Neurological:     Mental Status: She is alert and oriented to person, place, and time.     Cranial Nerves: No cranial nerve deficit.     Coordination: Coordination normal.     Gait: Gait normal.  Psychiatric:        Mood and Affect: Mood normal.        Behavior: Behavior normal.        Thought Content: Thought content normal.        Judgment: Judgment normal.        Assessment/Plan: 1. Encounter for general adult medical examination with abnormal findings Age-appropriate preventive screenings and vaccinations discussed, annual physical exam completed. Routine labs for health maintenance ordered, see below. PHM updated.  All other preventive screenings up-to-date - Vitamin D (25 hydroxy) - CMP14+EGFR - Lipid Profile  2. Essential hypertension Stable, refills ordered. - amLODipine (NORVASC) 10 MG tablet; Take 1 tablet (10 mg total) by mouth daily.  Dispense: 90 tablet; Refill: 3 - CMP14+EGFR  3. Other iron deficiency anemia Followed by hematology, Dr. Tasia Catchings  4. Tinea versicolor Ketoconazole refills ordered - ketoconazole (NIZORAL) 200 MG tablet; Take 1 tablet (200 mg total) by mouth daily.  Dispense: 3 tablet; Refill: 0 - CMP14+EGFR  5. B12 deficiency May continue to get monthly B12 injections if desired, also being followed by Dr. Tasia Catchings hematology  6. Mixed hyperlipidemia Routine labs ordered - CMP14+EGFR - Lipid Profile  7. Vitamin D deficiency We will routine labs ordered - Vitamin D (25 hydroxy) - CMP14+EGFR  8. Mixed insomnia Ambien continues to remain effective, 3 months of refills ordered, follow-up in 3 months for additional - zolpidem (AMBIEN) 10 MG tablet; Take 1 tablet (10 mg total) by mouth at bedtime as needed for sleep.  Dispense: 30 tablet; Refill: 2  9. Generalized anxiety disorder with panic attacks Stable, refills ordered. - mirtazapine (REMERON) 30 MG tablet; Take 1 tablet (30 mg total) by mouth at bedtime.  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 - mirtazapine (REMERON) 30 MG tablet; Take 1 tablet (30 mg total) by mouth at bedtime.  Dispense: 90 tablet; Refill: 3      General Counseling: Resha verbalizes understanding of the findings of todays visit and agrees with plan of treatment. I have discussed any further diagnostic evaluation that may be needed or ordered today. We also reviewed her medications today. she has been encouraged to call the office with any questions or concerns that should arise related to todays visit.    Orders Placed This Encounter  Procedures   Vitamin D (25 hydroxy)   CMP14+EGFR   Lipid Profile    Meds ordered this encounter  Medications   ketoconazole (NIZORAL) 200 MG tablet    Sig: Take 1 tablet (200 mg total) by mouth daily.    Dispense:  3 tablet    Refill:  0   zolpidem (AMBIEN) 10 MG tablet    Sig: Take 1 tablet (10 mg total) by mouth at bedtime as needed for sleep.    Dispense:  30 tablet    Refill:  2   amLODipine (NORVASC) 10 MG tablet    Sig: Take 1 tablet (10 mg total) by mouth daily.    Dispense:  90 tablet    Refill:  3   mirtazapine (REMERON) 30 MG tablet    Sig: Take 1 tablet (30 mg total) by mouth at bedtime.    Dispense:  90 tablet  Refill:  3    New prescription, please discontinue previous orders of mirtazapine.    Return in about 3 months (around 05/10/2022) for F/U, med refill, Anikin Prosser PCP please print out lab req for labs ordered today. .   Total time spent: 30 minutes Time spent includes review of chart, medications, test results, and follow up plan with the patient.   [place] Controlled Substance Database was reviewed by me.  This patient was seen by Jonetta Osgood, FNP-C in collaboration with Dr. Clayborn Bigness as a part of collaborative care agreement.  Crysta Gulick R. Valetta Fuller, MSN, FNP-C Internal medicine

## 2022-02-08 ENCOUNTER — Encounter: Payer: Self-pay | Admitting: Nurse Practitioner

## 2022-02-09 LAB — CMP14+EGFR
ALT: 10 IU/L (ref 0–32)
AST: 12 IU/L (ref 0–40)
Albumin/Globulin Ratio: 2.4 — ABNORMAL HIGH (ref 1.2–2.2)
Albumin: 4.5 g/dL (ref 3.8–4.9)
Alkaline Phosphatase: 67 IU/L (ref 44–121)
BUN/Creatinine Ratio: 11 (ref 9–23)
BUN: 7 mg/dL (ref 6–24)
Bilirubin Total: 0.2 mg/dL (ref 0.0–1.2)
CO2: 22 mmol/L (ref 20–29)
Calcium: 9.3 mg/dL (ref 8.7–10.2)
Chloride: 103 mmol/L (ref 96–106)
Creatinine, Ser: 0.64 mg/dL (ref 0.57–1.00)
Globulin, Total: 1.9 g/dL (ref 1.5–4.5)
Glucose: 93 mg/dL (ref 70–99)
Potassium: 4.4 mmol/L (ref 3.5–5.2)
Sodium: 139 mmol/L (ref 134–144)
Total Protein: 6.4 g/dL (ref 6.0–8.5)
eGFR: 106 mL/min/{1.73_m2} (ref >59)

## 2022-02-09 LAB — LIPID PANEL
Chol/HDL Ratio: 2.5 ratio (ref 0.0–4.4)
Cholesterol, Total: 208 mg/dL — ABNORMAL HIGH (ref 100–199)
HDL: 83 mg/dL (ref >39)
LDL Chol Calc (NIH): 100 mg/dL — ABNORMAL HIGH (ref 0–99)
Triglycerides: 147 mg/dL (ref 0–149)
VLDL Cholesterol Cal: 25 mg/dL (ref 5–40)

## 2022-02-09 LAB — VITAMIN D 25 HYDROXY (VIT D DEFICIENCY, FRACTURES): Vit D, 25-Hydroxy: 126 ng/mL — ABNORMAL HIGH (ref 30.0–100.0)

## 2022-02-16 ENCOUNTER — Ambulatory Visit (INDEPENDENT_AMBULATORY_CARE_PROVIDER_SITE_OTHER): Payer: PPO

## 2022-02-16 DIAGNOSIS — E538 Deficiency of other specified B group vitamins: Secondary | ICD-10-CM

## 2022-02-16 MED ORDER — CYANOCOBALAMIN 1000 MCG/ML IJ SOLN
1000.0000 ug | Freq: Once | INTRAMUSCULAR | Status: AC
  Administered 2022-02-16: 1000 ug via INTRAMUSCULAR

## 2022-04-20 ENCOUNTER — Encounter: Payer: Self-pay | Admitting: Nurse Practitioner

## 2022-05-05 ENCOUNTER — Ambulatory Visit (INDEPENDENT_AMBULATORY_CARE_PROVIDER_SITE_OTHER): Payer: PPO | Admitting: Nurse Practitioner

## 2022-05-05 ENCOUNTER — Encounter: Payer: Self-pay | Admitting: Nurse Practitioner

## 2022-05-05 VITALS — BP 137/89 | HR 94 | Temp 97.7°F | Resp 16 | Ht 64.0 in | Wt 123.4 lb

## 2022-05-05 DIAGNOSIS — G894 Chronic pain syndrome: Secondary | ICD-10-CM | POA: Diagnosis not present

## 2022-05-05 DIAGNOSIS — F411 Generalized anxiety disorder: Secondary | ICD-10-CM

## 2022-05-05 DIAGNOSIS — I10 Essential (primary) hypertension: Secondary | ICD-10-CM | POA: Diagnosis not present

## 2022-05-05 DIAGNOSIS — G4700 Insomnia, unspecified: Secondary | ICD-10-CM

## 2022-05-05 DIAGNOSIS — F410 Panic disorder [episodic paroxysmal anxiety] without agoraphobia: Secondary | ICD-10-CM

## 2022-05-05 NOTE — Progress Notes (Cosign Needed Addendum)
[facility] [address] [place], [place] [address]  Internal MEDICINE  Office Visit Note  Patient Name: Phyllis Murphy  [id number]  [id number]  Date of Service: 05/05/2022  Chief Complaint  Patient presents with   Follow-up    Follow up med refill   Hypertension    HPI Phyllis Murphy presents for a follow up visit for hypertension, insomnia, anxiety and medication refills.  Was taking ambien for sleep but having issues with memory loss and wants to taper off Remus Loffler now and try something else. Sees a pain medicine specialist, not sure which sleep medication they would be ok with her taking. Mirtazapine helps with anx/dep but not really for sleep. Cannot have alprazolam per pain doctor.  Chronic neck and back pain -- pain medicine specialist is working with her BP controlled with amlodipine 10 mg daily and clonidine 0.1 mg daily Anxiety is ok right now Possibly moving to [place] but will discuss more later.     Current Medication: Outpatient Encounter Medications as of 05/05/2022  Medication Sig Note   amLODipine (NORVASC) 10 MG tablet Take 1 tablet (10 mg total) by mouth daily.    cyclobenzaprine (FLEXERIL) 10 MG tablet Take 10 mg by mouth 4 (four) times daily.  04/07/2015: Received from: External Pharmacy   diphenhydrAMINE (BENADRYL) 25 mg capsule Take 25 mg by mouth every 6 (six) hours as needed.    oxyCODONE-acetaminophen (PERCOCET) 7.5-325 MG tablet TAKE 1 TABLET BY MOUTH TWICE A DAY FOR TOTAL DOSE OF 7.5/5/7.5/5. TO FILL 01-15-16. 01/21/2016: Received from: External Pharmacy   [DISCONTINUED] ibuprofen (ADVIL) 600 MG tablet ibuprofen 600 mg tablet  Take 1 tablet 3 times a day by oral route.    [DISCONTINUED] ketoconazole (NIZORAL) 200 MG tablet Take 1 tablet (200 mg total) by mouth daily.    [DISCONTINUED] mirtazapine (REMERON) 30 MG tablet Take 1 tablet (30 mg total) by mouth at bedtime.    [DISCONTINUED] zolpidem (AMBIEN) 10 MG tablet Take 1 tablet (10 mg total) by  mouth at bedtime as needed for sleep.    [DISCONTINUED] doxepin (SINEQUAN) 10 MG capsule Take 10 mg by mouth at bedtime.    No facility-administered encounter medications on file as of 05/05/2022.    Surgical History: Past Surgical History:  Procedure Laterality Date   ABDOMINAL HYSTERECTOMY     AUGMENTATION MAMMAPLASTY Bilateral 2004   BREAST ENHANCEMENT SURGERY     CERVICAL FUSION     ELBOW SURGERY     two surgeries 6 yrs ago 2017   trigger finger release surgery Right 09/30/2021    Medical History: Past Medical History:  Diagnosis Date   Anxiety    DDD (degenerative disc disease), cervical    History of breast augmentation    2007   Hx of hysterectomy    Hypertension    IDA (iron deficiency anemia) 10/15/2021    Family History: Family History  Problem Relation Age of Onset   Brain cancer Mother    Hypertension Father    Diabetes Father    Stroke Father    Breast cancer Neg Hx     Social History   Socioeconomic History   Marital status: Married    Spouse name: Not on file   Number of children: Not on file   Years of education: Not on file   Highest education level: Not on file  Occupational History   Occupation: [employer] started 2019  Tobacco Use   Smoking status: Some Days    Packs/day: 1.00  Years: 35.00    Total pack years: 35.00    Types: E-cigarettes, Cigarettes    Start date: 08/22/1980    Last attempt to quit: 11/20/2020    Years since quitting: 1.6   Smokeless tobacco: Never   Tobacco comments:    Vapes sometimes, pt reports no longer smokes cigarettes, but does vape some.   Vaping Use   Vaping Use: Every day   Devices: no nictonie  Substance and Sexual Activity   Alcohol use: No    Alcohol/week: 0.0 standard drinks of alcohol   Drug use: No   Sexual activity: Yes  Other Topics Concern   Not on file  Social History Narrative   ** Merged History Encounter **       Social Determinants of Health   Financial Resource Strain: Not on file   Food Insecurity: Not on file  Transportation Needs: Not on file  Physical Activity: Not on file  Stress: Not on file  Social Connections: Not on file  Intimate Partner Violence: Not on file      Review of Systems  Constitutional:  Positive for fatigue. Negative for chills and unexpected weight change.  HENT:  Positive for postnasal drip. Negative for congestion, rhinorrhea, sneezing and sore throat.   Eyes:  Negative for redness.  Respiratory:  Negative for cough, chest tightness, shortness of breath and wheezing.   Cardiovascular: Negative.  Negative for chest pain and palpitations.  Gastrointestinal: Negative.  Negative for abdominal pain, constipation, diarrhea, nausea and vomiting.  Genitourinary:  Negative for dysuria and frequency.  Musculoskeletal:  Positive for arthralgias, back pain and neck pain. Negative for joint swelling.       Chronic pains, sees pain medicine specialist  Skin: Negative.  Negative for rash.  Neurological: Negative.  Negative for tremors and numbness.  Hematological:  Negative for adenopathy. Does not bruise/bleed easily.  Psychiatric/Behavioral:  Positive for sleep disturbance. Negative for behavioral problems (Depression), self-injury and suicidal ideas. The patient is nervous/anxious and is hyperactive.     Vital Signs: BP 137/89   Pulse 94   Temp 97.7 F (36.5 C)   Resp 16   Ht 5\' 4"  (1.626 m)   Wt 123 lb 6.4 oz (56 kg)   SpO2 99%   BMI 21.18 kg/m    Physical Exam Vitals reviewed.  Constitutional:      General: She is not in acute distress.    Appearance: Normal appearance. She is normal weight. She is not ill-appearing.  HENT:     Head: Normocephalic and atraumatic.  Eyes:     Pupils: Pupils are equal, round, and reactive to light.  Cardiovascular:     Rate and Rhythm: Normal rate and regular rhythm.  Pulmonary:     Effort: Pulmonary effort is normal. No respiratory distress.  Neurological:     Mental Status: She is alert and  oriented to person, place, and time.     Cranial Nerves: No cranial nerve deficit.     Coordination: Coordination normal.     Gait: Gait normal.  Psychiatric:        Mood and Affect: Mood normal.        Behavior: Behavior normal.        Assessment/Plan: 1. Essential hypertension Stable, continue amlodipine and clonidine as prescribed.   2. Mixed insomnia Unsure of what medications are ok with her pain doctor to try to help her sleep. Alprazolam and Lorrin Mais are both not acceptable choices. Mirtazapine does not work well for her.  There are several others, patient provided with a list of medications to ask her pain doctor about so that they can tell her which medication they are ok with me prescribing to help her sleep and that will not interfere or interact with their treatment of her chronic pain  3. Chronic pain syndrome Continue to follow up with pain medicine as instructed  4. Generalized anxiety disorder with panic attacks Stable, no changes.    General Counseling: Aliyana verbalizes understanding of the findings of todays visit and agrees with plan of treatment. I have discussed any further diagnostic evaluation that may be needed or ordered today. We also reviewed her medications today. she has been encouraged to call the office with any questions or concerns that should arise related to todays visit.    No orders of the defined types were placed in this encounter.   No orders of the defined types were placed in this encounter.   Return in 3 months (on 08/04/2022) for F/U, Charlsie Fleeger PCP and patient will call or message about sleep med.   Total time spent:30 Minutes Time spent includes review of chart, medications, test results, and follow up plan with the patient.   [place] Controlled Substance Database was reviewed by me.  This patient was seen by Sallyanne Kuster, FNP-C in collaboration with Dr. Beverely Risen as a part of collaborative care agreement.   Hisao Doo R. Tedd Sias,  MSN, FNP-C Internal medicine

## 2022-05-06 ENCOUNTER — Encounter: Payer: Self-pay | Admitting: Nurse Practitioner

## 2022-05-11 ENCOUNTER — Encounter: Payer: Self-pay | Admitting: Nurse Practitioner

## 2022-05-12 ENCOUNTER — Encounter: Payer: Self-pay | Admitting: Nurse Practitioner

## 2022-05-12 ENCOUNTER — Other Ambulatory Visit: Payer: Self-pay | Admitting: Internal Medicine

## 2022-05-12 DIAGNOSIS — B360 Pityriasis versicolor: Secondary | ICD-10-CM

## 2022-05-12 MED ORDER — IBUPROFEN 800 MG PO TABS: ORAL_TABLET | ORAL | 0 refills | Status: DC

## 2022-05-12 MED ORDER — TRAZODONE HCL 50 MG PO TABS: ORAL_TABLET | ORAL | 3 refills | Status: DC

## 2022-05-12 NOTE — Telephone Encounter (Signed)
review 

## 2022-05-28 ENCOUNTER — Telehealth: Payer: PPO | Admitting: Family Medicine

## 2022-05-28 DIAGNOSIS — N3000 Acute cystitis without hematuria: Secondary | ICD-10-CM | POA: Diagnosis not present

## 2022-05-28 MED ORDER — CIPROFLOXACIN HCL 500 MG PO TABS: 500.0000 mg | ORAL_TABLET | Freq: Two times a day (BID) | ORAL | 0 refills | Status: AC

## 2022-05-28 NOTE — Progress Notes (Signed)
Virtual Visit Consent   [name], you are scheduled for a virtual visit with a [facility] provider today. Just as with appointments in the office, your consent must be obtained to participate. Your consent will be active for this visit and any virtual visit you may have with one of our providers in the next 365 days. If you have a MyChart account, a copy of this consent can be sent to you electronically.  As this is a virtual visit, video technology does not allow for your provider to perform a traditional examination. This may limit your provider's ability to fully assess your condition. If your provider identifies any concerns that need to be evaluated in person or the need to arrange testing (such as labs, EKG, etc.), we will make arrangements to do so. Although advances in technology are sophisticated, we cannot ensure that it will always work on either your end or our end. If the connection with a video visit is poor, the visit may have to be switched to a telephone visit. With either a video or telephone visit, we are not always able to ensure that we have a secure connection.  By engaging in this virtual visit, you consent to the provision of healthcare and authorize for your insurance to be billed (if applicable) for the services provided during this visit. Depending on your insurance coverage, you may receive a charge related to this service.  I need to obtain your verbal consent now. Are you willing to proceed with your visit today? Phyllis Murphy has provided verbal consent on 05/28/2022 for a virtual visit (video or telephone). Dellia Nims, FNP  Date: 05/28/2022 10:17 AM  Virtual Visit via Video Note   I, Dellia Nims, connected with  Phyllis Murphy  ([id number], September 16, 1965) on 05/28/22 at 10:15 AM EDT by a video-enabled telemedicine application and verified that I am speaking with the correct person using two identifiers.  Location: Patient: Virtual Visit Location Patient:  Home Provider: Virtual Visit Location Provider: Home Office   I discussed the limitations of evaluation and management by telemedicine and the availability of in person appointments. The patient expressed understanding and agreed to proceed.    History of Present Illness: Phyllis Murphy is a 56 y.o. who identifies as a female who was assigned female at birth, and is being seen today for burning on urination with frequency. No fever. Prefers cipro. Has frequent UTI's/  Taking AZO which helps. Allergic to sulfa. Marland Kitchen  HPI: HPI  Problems:  Patient Active Problem List   Diagnosis Date Noted   IDA (iron deficiency anemia) 10/15/2021   Generalized anxiety disorder with panic attacks 02/06/2021   Idiopathic gout 11/21/2018   Drug-induced constipation 11/14/2018   Ganglion cyst of dorsum of left wrist 06/20/2018   Mixed incontinence urge and stress 06/20/2018   Tinea versicolor 06/20/2018   Mixed hyperlipidemia 07/13/2015   Tobacco abuse 07/13/2015   Cervical spinal stenosis 04/04/2012   Essential hypertension 04/04/2012   Back ache 02/03/2012   Hand numbness 02/03/2012    Allergies:  Allergies  Allergen Reactions   Esmolol Anaphylaxis   Latex Anaphylaxis   Bacitracin Hives and Itching   Other Hives    Dermabond   Sulfa Antibiotics Hives and Itching   Medications:  Current Outpatient Medications:    ciprofloxacin (CIPRO) 500 MG tablet, Take 1 tablet (500 mg total) by mouth 2 (two) times daily for 7 days., Disp: 14 tablet, Rfl: 0   amLODipine (NORVASC) 10 MG  tablet, Take 1 tablet (10 mg total) by mouth daily., Disp: 90 tablet, Rfl: 3   cyclobenzaprine (FLEXERIL) 10 MG tablet, Take 10 mg by mouth 4 (four) times daily. , Disp: , Rfl: 2   diphenhydrAMINE (BENADRYL) 25 mg capsule, Take 25 mg by mouth every 6 (six) hours as needed., Disp: , Rfl:    ibuprofen (ADVIL) 800 MG tablet, One tab po bid with food as needed, Disp: 60 tablet, Rfl: 0   oxyCODONE-acetaminophen (PERCOCET) 7.5-325 MG  tablet, TAKE 1 TABLET BY MOUTH TWICE A DAY FOR TOTAL DOSE OF 7.5/5/7.5/5. TO FILL 01-15-16., Disp: , Rfl: 0   traZODone (DESYREL) 50 MG tablet, Take one tab a t bed time for one week and then increase to 2 at bed time for insonia, Disp: 60 tablet, Rfl: 3   zolpidem (AMBIEN) 10 MG tablet, Take 1 tablet (10 mg total) by mouth at bedtime as needed for sleep., Disp: 30 tablet, Rfl: 2  Observations/Objective: Patient is well-developed, well-nourished in no acute distress.  Resting comfortably  at home.  Head is normocephalic, atraumatic.  No labored breathing.  Speech is clear and coherent with logical content.  Patient is alert and oriented at baseline.    Assessment and Plan: 1. Acute cystitis without hematuria  Increase fluids, understands preventative measures, urgent care or pcp if sx persist or worsen.   Follow Up Instructions: I discussed the assessment and treatment plan with the patient. The patient was provided an opportunity to ask questions and all were answered. The patient agreed with the plan and demonstrated an understanding of the instructions.  A copy of instructions were sent to the patient via MyChart unless otherwise noted below.     The patient was advised to call back or seek an in-person evaluation if the symptoms worsen or if the condition fails to improve as anticipated.  Time:  I spent 10 minutes with the patient via telehealth technology discussing the above problems/concerns.    Phyllis Curio, FNP

## 2022-05-28 NOTE — Patient Instructions (Signed)
Urinary Tract Infection, Adult  A urinary tract infection (UTI) is an infection of any part of the urinary tract. The urinary tract includes the kidneys, ureters, bladder, and urethra. These organs make, store, and get rid of urine in the body. An upper UTI affects the ureters and kidneys. A lower UTI affects the bladder and urethra. What are the causes? Most urinary tract infections are caused by bacteria in your genital area around your urethra, where urine leaves your body. These bacteria grow and cause inflammation of your urinary tract. What increases the risk? You are more likely to develop this condition if: You have a urinary catheter that stays in place. You are not able to control when you urinate or have a bowel movement (incontinence). You are female and you: Use a spermicide or diaphragm for birth control. Have low estrogen levels. Are pregnant. You have certain genes that increase your risk. You are sexually active. You take antibiotic medicines. You have a condition that causes your flow of urine to slow down, such as: An enlarged prostate, if you are female. Blockage in your urethra. A kidney stone. A nerve condition that affects your bladder control (neurogenic bladder). Not getting enough to drink, or not urinating often. You have certain medical conditions, such as: Diabetes. A weak disease-fighting system (immunesystem). Sickle cell disease. Gout. Spinal cord injury. What are the signs or symptoms? Symptoms of this condition include: Needing to urinate right away (urgency). Frequent urination. This may include small amounts of urine each time you urinate. Pain or burning with urination. Blood in the urine. Urine that smells bad or unusual. Trouble urinating. Cloudy urine. Vaginal discharge, if you are female. Pain in the abdomen or the lower back. You may also have: Vomiting or a decreased appetite. Confusion. Irritability or tiredness. A fever or  chills. Diarrhea. The first symptom in older adults may be confusion. In some cases, they may not have any symptoms until the infection has worsened. How is this diagnosed? This condition is diagnosed based on your medical history and a physical exam. You may also have other tests, including: Urine tests. Blood tests. Tests for STIs (sexually transmitted infections). If you have had more than one UTI, a cystoscopy or imaging studies may be done to determine the cause of the infections. How is this treated? Treatment for this condition includes: Antibiotic medicine. Over-the-counter medicines to treat discomfort. Drinking enough water to stay hydrated. If you have frequent infections or have other conditions such as a kidney stone, you may need to see a health care provider who specializes in the urinary tract (urologist). In rare cases, urinary tract infections can cause sepsis. Sepsis is a life-threatening condition that occurs when the body responds to an infection. Sepsis is treated in the hospital with IV antibiotics, fluids, and other medicines. Follow these instructions at home:  Medicines Take over-the-counter and prescription medicines only as told by your health care provider. If you were prescribed an antibiotic medicine, take it as told by your health care provider. Do not stop using the antibiotic even if you start to feel better. General instructions Make sure you: Empty your bladder often and completely. Do not hold urine for long periods of time. Empty your bladder after sex. Wipe from front to back after urinating or having a bowel movement if you are female. Use each tissue only one time when you wipe. Drink enough fluid to keep your urine pale yellow. Keep all follow-up visits. This is important. Contact a health   care provider if: Your symptoms do not get better after 1-2 days. Your symptoms go away and then return. Get help right away if: You have severe pain in  your back or your lower abdomen. You have a fever or chills. You have nausea or vomiting. Summary A urinary tract infection (UTI) is an infection of any part of the urinary tract, which includes the kidneys, ureters, bladder, and urethra. Most urinary tract infections are caused by bacteria in your genital area. Treatment for this condition often includes antibiotic medicines. If you were prescribed an antibiotic medicine, take it as told by your health care provider. Do not stop using the antibiotic even if you start to feel better. Keep all follow-up visits. This is important. This information is not intended to replace advice given to you by your health care provider. Make sure you discuss any questions you have with your health care provider. Document Revised: 03/20/2020 Document Reviewed: 03/20/2020 Elsevier Patient Education  2023 Elsevier Inc.  

## 2022-05-31 ENCOUNTER — Ambulatory Visit (INDEPENDENT_AMBULATORY_CARE_PROVIDER_SITE_OTHER): Payer: PPO | Admitting: Internal Medicine

## 2022-05-31 ENCOUNTER — Encounter: Payer: Self-pay | Admitting: Internal Medicine

## 2022-05-31 VITALS — BP 132/104 | HR 108 | Temp 98.2°F | Resp 16 | Ht 64.0 in | Wt 116.0 lb

## 2022-05-31 DIAGNOSIS — G44211 Episodic tension-type headache, intractable: Secondary | ICD-10-CM

## 2022-05-31 DIAGNOSIS — I10 Essential (primary) hypertension: Secondary | ICD-10-CM

## 2022-05-31 DIAGNOSIS — L608 Other nail disorders: Secondary | ICD-10-CM

## 2022-05-31 MED ORDER — CLONIDINE HCL 0.1 MG PO TABS: ORAL_TABLET | ORAL | 3 refills | Status: DC

## 2022-05-31 NOTE — Progress Notes (Signed)
[facility] [place], [place] [address]  Internal MEDICINE  Office Visit Note  Patient Name: Phyllis Murphy  [id number]  [id number]  Date of Service: 06/02/2022  Chief Complaint  Patient presents with   Follow-up   Hypertension   Nail Problem    Toe fungus on right big toe   Headache    Gets headaches daily around 5:30/6pm - possibly related to stress and high BP    HPI Pt is here for acute visit Blood pressure has been labile, under stress, has been having headaches too.she is trying to relocate and has been packing, takes all her meds  Seen by pain management as well  She is also on Ambien and trazodone  Great toe was hurts and now discolored   Current Medication: Outpatient Encounter Medications as of 05/31/2022  Medication Sig Note   amLODipine (NORVASC) 10 MG tablet Take 1 tablet (10 mg total) by mouth daily.    ciprofloxacin (CIPRO) 500 MG tablet Take 1 tablet (500 mg total) by mouth 2 (two) times daily for 7 days.    cloNIDine (CATAPRES) 0.1 MG tablet Take one tab a day in am    cyclobenzaprine (FLEXERIL) 10 MG tablet Take 10 mg by mouth 4 (four) times daily.  04/07/2015: Received from: External Pharmacy   diphenhydrAMINE (BENADRYL) 25 mg capsule Take 25 mg by mouth every 6 (six) hours as needed.    ibuprofen (ADVIL) 800 MG tablet One tab po bid with food as needed    oxyCODONE-acetaminophen (PERCOCET) 7.5-325 MG tablet TAKE 1 TABLET BY MOUTH TWICE A DAY FOR TOTAL DOSE OF 7.5/5/7.5/5. TO FILL 01-15-16. 01/21/2016: Received from: External Pharmacy   traZODone (DESYREL) 50 MG tablet Take one tab a t bed time for one week and then increase to 2 at bed time for insonia    zolpidem (AMBIEN) 10 MG tablet Take 1 tablet (10 mg total) by mouth at bedtime as needed for sleep.    No facility-administered encounter medications on file as of 05/31/2022.    Surgical History: Past Surgical History:  Procedure Laterality Date   ABDOMINAL HYSTERECTOMY      AUGMENTATION MAMMAPLASTY Bilateral 2004   BREAST ENHANCEMENT SURGERY     CERVICAL FUSION     ELBOW SURGERY     two surgeries 6 yrs ago 2017   trigger finger release surgery Right 09/30/2021    Medical History: Past Medical History:  Diagnosis Date   Anxiety    DDD (degenerative disc disease), cervical    History of breast augmentation    2007   Hx of hysterectomy    Hypertension    IDA (iron deficiency anemia) 10/15/2021    Family History: Family History  Problem Relation Age of Onset   Brain cancer Mother    Hypertension Father    Diabetes Father    Stroke Father    Breast cancer Neg Hx     Social History   Socioeconomic History   Marital status: Married    Spouse name: Not on file   Number of children: Not on file   Years of education: Not on file   Highest education level: Not on file  Occupational History   Occupation: [employer] started 2019  Tobacco Use   Smoking status: Some Days    Packs/day: 1.00    Years: 35.00    Total pack years: 35.00    Types: E-cigarettes, Cigarettes    Start date: 08/22/1980    Last attempt to quit:  11/20/2020    Years since quitting: 1.5   Smokeless tobacco: Never   Tobacco comments:    Vapes sometimes, pt reports no longer smokes cigarettes, but does vape some.   Vaping Use   Vaping Use: Every day   Devices: no nictonie  Substance and Sexual Activity   Alcohol use: No    Alcohol/week: 0.0 standard drinks of alcohol   Drug use: No   Sexual activity: Yes  Other Topics Concern   Not on file  Social History Narrative   ** Merged History Encounter **       Social Determinants of Health   Financial Resource Strain: Not on file  Food Insecurity: Not on file  Transportation Needs: Not on file  Physical Activity: Not on file  Stress: Not on file  Social Connections: Not on file  Intimate Partner Violence: Not on file      Review of Systems  Constitutional:  Negative for fatigue and fever.  HENT:  Negative for congestion,  mouth sores and postnasal drip.   Respiratory:  Negative for cough.   Cardiovascular:  Negative for chest pain.  Genitourinary:  Negative for flank pain.  Psychiatric/Behavioral: Negative.      Vital Signs: BP (!) 132/104   Pulse (!) 108   Temp 98.2 F (36.8 C)   Resp 16   Ht 5\' 4"  (1.626 m)   Wt 116 lb (52.6 kg)   SpO2 100%   BMI 19.91 kg/m    Physical Exam Constitutional:      Appearance: Normal appearance.  HENT:     Head: Normocephalic and atraumatic.     Nose: Nose normal.     Mouth/Throat:     Mouth: Mucous membranes are moist.     Pharynx: No posterior oropharyngeal erythema.  Eyes:     Extraocular Movements: Extraocular movements intact.     Pupils: Pupils are equal, round, and reactive to light.  Cardiovascular:     Pulses: Normal pulses.     Heart sounds: Normal heart sounds.  Pulmonary:     Effort: Pulmonary effort is normal.     Breath sounds: Normal breath sounds.  Neurological:     General: No focal deficit present.     Mental Status: She is alert.  Psychiatric:        Mood and Affect: Mood normal.        Behavior: Behavior normal.        Assessment/Plan: 1. Benign hypertension Will continue Norvasc and add Clonidine in am - cloNIDine (CATAPRES) 0.1 MG tablet; Take one tab a day in am  Dispense: 30 tablet; Refill: 3  2. Nail discoloration This is traumatic, will improve overtime, no need for any intervention   3. Intractable episodic tension-type headache Improved    General Counseling: Albie verbalizes understanding of the findings of todays visit and agrees with plan of treatment. I have discussed any further diagnostic evaluation that may be needed or ordered today. We also reviewed her medications today. she has been encouraged to call the office with any questions or concerns that should arise related to todays visit.    No orders of the defined types were placed in this encounter.   Meds ordered this encounter  Medications    cloNIDine (CATAPRES) 0.1 MG tablet    Sig: Take one tab a day in am    Dispense:  30 tablet    Refill:  3    Total time spent:30 Minutes Time spent includes review of chart,  medications, test results, and follow up plan with the patient.   [place] Controlled Substance Database was reviewed by me.   Dr Lavera Guise Internal medicine

## 2022-06-04 ENCOUNTER — Other Ambulatory Visit: Payer: Self-pay | Admitting: Internal Medicine

## 2022-06-05 ENCOUNTER — Other Ambulatory Visit: Payer: Self-pay | Admitting: Internal Medicine

## 2022-06-06 NOTE — Telephone Encounter (Signed)
yes

## 2022-06-06 NOTE — Telephone Encounter (Signed)
Its ok to send 90 days  

## 2022-06-08 ENCOUNTER — Encounter: Payer: Self-pay | Admitting: Internal Medicine

## 2022-06-08 ENCOUNTER — Encounter: Payer: Self-pay | Admitting: Nurse Practitioner

## 2022-06-29 ENCOUNTER — Encounter: Payer: Self-pay | Admitting: Nurse Practitioner

## 2022-07-03 ENCOUNTER — Other Ambulatory Visit: Payer: Self-pay | Admitting: Nurse Practitioner

## 2022-07-04 ENCOUNTER — Ambulatory Visit (INDEPENDENT_AMBULATORY_CARE_PROVIDER_SITE_OTHER): Payer: PPO | Admitting: Nurse Practitioner

## 2022-07-04 ENCOUNTER — Encounter: Payer: Self-pay | Admitting: Nurse Practitioner

## 2022-07-04 VITALS — BP 119/85 | HR 98 | Temp 98.3°F | Resp 16 | Ht 64.0 in | Wt 113.0 lb

## 2022-07-04 DIAGNOSIS — G4700 Insomnia, unspecified: Secondary | ICD-10-CM

## 2022-07-04 DIAGNOSIS — I10 Essential (primary) hypertension: Secondary | ICD-10-CM

## 2022-07-04 DIAGNOSIS — G894 Chronic pain syndrome: Secondary | ICD-10-CM | POA: Diagnosis not present

## 2022-07-04 MED ORDER — AMLODIPINE BESYLATE 2.5 MG PO TABS: 2.5000 mg | ORAL_TABLET | Freq: Every day | ORAL | 3 refills | Status: DC | PRN

## 2022-07-04 NOTE — Progress Notes (Cosign Needed)
[facility] [place], [place] [address]  Internal MEDICINE  Office Visit Note  Patient Name: Phyllis Murphy  [id number]  [id number]  Date of Service: 07/04/2022  Chief Complaint  Patient presents with   Follow-up   Hypertension    HPI Phyllis Murphy presents for a follow-up visit for hypertension, insomnia and refills.  Trazodone working well, sleeping better Elevated BP in evening. Does not like clonidine, does not like how it makes her feel.  Eventually moving to [place]    Current Medication: Outpatient Encounter Medications as of 07/04/2022  Medication Sig Note   amLODipine (NORVASC) 10 MG tablet Take 1 tablet (10 mg total) by mouth daily.    amLODipine (NORVASC) 2.5 MG tablet Take 1 tablet (2.5 mg total) by mouth daily as needed (elevated BP in the evening >140/90).    cyclobenzaprine (FLEXERIL) 10 MG tablet Take 10 mg by mouth 4 (four) times daily.  04/07/2015: Received from: External Pharmacy   diphenhydrAMINE (BENADRYL) 25 mg capsule Take 25 mg by mouth every 6 (six) hours as needed.    ibuprofen (ADVIL) 800 MG tablet TAKE 1 TABLET BY MOUTH TWICE A DAY WITH FOOD AS NEEDED    oxyCODONE-acetaminophen (PERCOCET) 7.5-325 MG tablet TAKE 1 TABLET BY MOUTH TWICE A DAY FOR TOTAL DOSE OF 7.5/5/7.5/5. TO FILL 01-15-16. 01/21/2016: Received from: External Pharmacy   traZODone (DESYREL) 50 MG tablet TAKE ONE TAB A T BED TIME FOR ONE WEEK AND THEN INCREASE TO 2 AT BED TIME FOR INSONIA    [DISCONTINUED] cloNIDine (CATAPRES) 0.1 MG tablet Take one tab a day in am    No facility-administered encounter medications on file as of 07/04/2022.    Surgical History: Past Surgical History:  Procedure Laterality Date   ABDOMINAL HYSTERECTOMY     AUGMENTATION MAMMAPLASTY Bilateral 2004   BREAST ENHANCEMENT SURGERY     CERVICAL FUSION     ELBOW SURGERY     two surgeries 6 yrs ago 2017   trigger finger release surgery Right 09/30/2021    Medical History: Past Medical  History:  Diagnosis Date   Anxiety    DDD (degenerative disc disease), cervical    History of breast augmentation    2007   Hx of hysterectomy    Hypertension    IDA (iron deficiency anemia) 10/15/2021    Family History: Family History  Problem Relation Age of Onset   Brain cancer Mother    Hypertension Father    Diabetes Father    Stroke Father    Breast cancer Neg Hx     Social History   Socioeconomic History   Marital status: Married    Spouse name: Not on file   Number of children: Not on file   Years of education: Not on file   Highest education level: Not on file  Occupational History   Occupation: [employer] started 2019  Tobacco Use   Smoking status: Some Days    Packs/day: 1.00    Years: 35.00    Total pack years: 35.00    Types: E-cigarettes, Cigarettes    Start date: 08/22/1980    Last attempt to quit: 11/20/2020    Years since quitting: 1.6   Smokeless tobacco: Never   Tobacco comments:    Vapes sometimes, pt reports no longer smokes cigarettes, but does vape some.   Vaping Use   Vaping Use: Every day   Devices: no nictonie  Substance and Sexual Activity   Alcohol use: No    Alcohol/week:  0.0 standard drinks of alcohol   Drug use: No   Sexual activity: Yes  Other Topics Concern   Not on file  Social History Narrative   ** Merged History Encounter **       Social Determinants of Health   Financial Resource Strain: Not on file  Food Insecurity: Not on file  Transportation Needs: Not on file  Physical Activity: Not on file  Stress: Not on file  Social Connections: Not on file  Intimate Partner Violence: Not on file      Review of Systems  Constitutional:  Positive for fatigue. Negative for chills and unexpected weight change.  HENT:  Positive for postnasal drip. Negative for congestion, rhinorrhea, sneezing and sore throat.   Eyes:  Negative for redness.  Respiratory:  Negative for cough, chest tightness, shortness of breath and wheezing.    Cardiovascular: Negative.  Negative for chest pain and palpitations.  Gastrointestinal: Negative.  Negative for abdominal pain, constipation, diarrhea, nausea and vomiting.  Genitourinary:  Negative for dysuria and frequency.  Musculoskeletal:  Positive for arthralgias, back pain and neck pain. Negative for joint swelling.       Chronic pains, sees pain medicine specialist  Skin: Negative.  Negative for rash.  Neurological: Negative.  Negative for tremors and numbness.  Hematological:  Negative for adenopathy. Does not bruise/bleed easily.  Psychiatric/Behavioral:  Positive for sleep disturbance. Negative for behavioral problems (Depression), self-injury and suicidal ideas. The patient is nervous/anxious and is hyperactive.     Vital Signs: BP 119/85   Pulse 98   Temp 98.3 F (36.8 C)   Resp 16   Ht 5\' 4"  (1.626 m)   Wt 113 lb (51.3 kg)   SpO2 98%   BMI 19.40 kg/m    Physical Exam Vitals reviewed.  Constitutional:      General: She is not in acute distress.    Appearance: Normal appearance. She is normal weight. She is not ill-appearing.  HENT:     Head: Normocephalic and atraumatic.  Eyes:     Pupils: Pupils are equal, round, and reactive to light.  Cardiovascular:     Rate and Rhythm: Normal rate and regular rhythm.  Pulmonary:     Effort: Pulmonary effort is normal. No respiratory distress.  Neurological:     Mental Status: She is alert and oriented to person, place, and time.     Cranial Nerves: No cranial nerve deficit.     Coordination: Coordination normal.     Gait: Gait normal.  Psychiatric:        Mood and Affect: Mood normal.        Behavior: Behavior normal.        Assessment/Plan: 1. Essential hypertension Add prn 2.5 mg admlodipine in the evening as needed for elevated BP. Clonidine discontinued - amLODipine (NORVASC) 2.5 MG tablet; Take 1 tablet (2.5 mg total) by mouth daily as needed (elevated BP in the evening >140/90).  Dispense: 90 tablet;  Refill: 3  2. Mixed insomnia Trazodone is helping, continue as prescribed.   3. Chronic pain syndrome Sees pain management specialist.    General Counseling: Phyllis Murphy verbalizes understanding of the findings of todays visit and agrees with plan of treatment. I have discussed any further diagnostic evaluation that may be needed or ordered today. We also reviewed her medications today. she has been encouraged to call the office with any questions or concerns that should arise related to todays visit.    No orders of the defined types were  placed in this encounter.   Meds ordered this encounter  Medications   amLODipine (NORVASC) 2.5 MG tablet    Sig: Take 1 tablet (2.5 mg total) by mouth daily as needed (elevated BP in the evening >140/90).    Dispense:  90 tablet    Refill:  3    New script in addition to 10 mg tablet.    Return in about 15 days (around 07/19/2022) for F/U, BP check, Nickol Collister PCP .   Total time spent:30 Minutes Time spent includes review of chart, medications, test results, and follow up plan with the patient.   [place] Controlled Substance Database was reviewed by me.  This patient was seen by Jonetta Osgood, FNP-C in collaboration with Dr. Clayborn Bigness as a part of collaborative care agreement.   Devantae Babe R. Valetta Fuller, MSN, FNP-C Internal medicine

## 2022-07-18 ENCOUNTER — Other Ambulatory Visit: Payer: PPO

## 2022-07-19 ENCOUNTER — Encounter: Payer: Self-pay | Admitting: Nurse Practitioner

## 2022-07-19 ENCOUNTER — Ambulatory Visit (INDEPENDENT_AMBULATORY_CARE_PROVIDER_SITE_OTHER): Payer: PPO | Admitting: Nurse Practitioner

## 2022-07-19 VITALS — BP 125/70 | HR 95 | Temp 97.5°F | Resp 16 | Ht 64.0 in | Wt 114.6 lb

## 2022-07-19 DIAGNOSIS — I10 Essential (primary) hypertension: Secondary | ICD-10-CM | POA: Diagnosis not present

## 2022-07-19 DIAGNOSIS — E538 Deficiency of other specified B group vitamins: Secondary | ICD-10-CM

## 2022-07-19 DIAGNOSIS — Z716 Tobacco abuse counseling: Secondary | ICD-10-CM

## 2022-07-19 MED ORDER — CYANOCOBALAMIN 1000 MCG/ML IJ SOLN
1000.0000 ug | Freq: Once | INTRAMUSCULAR | Status: AC
  Administered 2022-07-19: 1000 ug via INTRAMUSCULAR

## 2022-07-19 NOTE — Progress Notes (Signed)
[facility] [address] [place], [place] [address]  Internal MEDICINE  Office Visit Note  Patient Name: Phyllis Murphy  [id number]  [id number]  Date of Service: 07/19/2022  Chief Complaint  Patient presents with   Follow-up   Hypertension    HPI Phyllis Murphy presents for a follow up visit for hypertension and BP recheck.  -BP has been better.  -Only had to take extra dose of amlodipine twice and it helped and brought her BP down. (SBP was in 150s) Smoking cigarettes still and vaping with nicotine,  Trying to quit smoking, having difficulty, progress is slow, declines any aide with smoking cessation for now.    Current Medication: Outpatient Encounter Medications as of 07/19/2022  Medication Sig Note   amLODipine (NORVASC) 10 MG tablet Take 1 tablet (10 mg total) by mouth daily.    amLODipine (NORVASC) 2.5 MG tablet Take 1 tablet (2.5 mg total) by mouth daily as needed (elevated BP in the evening >140/90).    cyclobenzaprine (FLEXERIL) 10 MG tablet Take 10 mg by mouth 4 (four) times daily.  04/07/2015: Received from: External Pharmacy   diphenhydrAMINE (BENADRYL) 25 mg capsule Take 25 mg by mouth every 6 (six) hours as needed.    ibuprofen (ADVIL) 800 MG tablet TAKE 1 TABLET BY MOUTH TWICE A DAY WITH FOOD AS NEEDED    oxyCODONE-acetaminophen (PERCOCET) 7.5-325 MG tablet TAKE 1 TABLET BY MOUTH TWICE A DAY FOR TOTAL DOSE OF 7.5/5/7.5/5. TO FILL 01-15-16. 01/21/2016: Received from: External Pharmacy   traZODone (DESYREL) 50 MG tablet TAKE ONE TAB A T BED TIME FOR ONE WEEK AND THEN INCREASE TO 2 AT BED TIME FOR INSONIA    [EXPIRED] cyanocobalamin (VITAMIN B12) injection 1,000 mcg     No facility-administered encounter medications on file as of 07/19/2022.    Surgical History: Past Surgical History:  Procedure Laterality Date   ABDOMINAL HYSTERECTOMY     AUGMENTATION MAMMAPLASTY Bilateral 2004   BREAST ENHANCEMENT SURGERY     CERVICAL FUSION     ELBOW SURGERY     two  surgeries 6 yrs ago 2017   trigger finger release surgery Right 09/30/2021    Medical History: Past Medical History:  Diagnosis Date   Anxiety    DDD (degenerative disc disease), cervical    History of breast augmentation    2007   Hx of hysterectomy    Hypertension    IDA (iron deficiency anemia) 10/15/2021    Family History: Family History  Problem Relation Age of Onset   Brain cancer Mother    Hypertension Father    Diabetes Father    Stroke Father    Breast cancer Neg Hx     Social History   Socioeconomic History   Marital status: Married    Spouse name: Not on file   Number of children: Not on file   Years of education: Not on file   Highest education level: Not on file  Occupational History   Occupation: [employer] started 2019  Tobacco Use   Smoking status: Some Days    Packs/day: 1.00    Years: 35.00    Total pack years: 35.00    Types: E-cigarettes, Cigarettes    Start date: 08/22/1980    Last attempt to quit: 11/20/2020    Years since quitting: 1.6   Smokeless tobacco: Never   Tobacco comments:    Vapes sometimes, pt reports no longer smokes cigarettes, but does vape some.   Vaping Use   Vaping Use:  Every day   Devices: no nictonie  Substance and Sexual Activity   Alcohol use: No    Alcohol/week: 0.0 standard drinks of alcohol   Drug use: No   Sexual activity: Yes  Other Topics Concern   Not on file  Social History Narrative   ** Merged History Encounter **       Social Determinants of Health   Financial Resource Strain: Not on file  Food Insecurity: Not on file  Transportation Needs: Not on file  Physical Activity: Not on file  Stress: Not on file  Social Connections: Not on file  Intimate Partner Violence: Not on file      Review of Systems  Constitutional:  Positive for appetite change. Negative for chills, fatigue and fever.  HENT:  Negative for congestion, mouth sores and postnasal drip.   Respiratory: Negative.  Negative for cough,  chest tightness, shortness of breath and wheezing.   Cardiovascular: Negative.  Negative for chest pain and palpitations.  Gastrointestinal: Negative.   Genitourinary:  Negative for flank pain.  Musculoskeletal: Negative.   Psychiatric/Behavioral:  Positive for sleep disturbance (improved with trazodone). Negative for self-injury and suicidal ideas. The patient is nervous/anxious (increased some due to smoking cessation and decrease intake of nicotine.).     Vital Signs: BP 125/70 Comment: 143/96  Pulse 95   Temp (!) 97.5 F (36.4 C)   Resp 16   Ht 5\' 4"  (1.626 m)   Wt 114 lb 9.6 oz (52 kg)   SpO2 98%   BMI 19.67 kg/m    Physical Exam Vitals reviewed.  Constitutional:      General: She is not in acute distress.    Appearance: Normal appearance. She is normal weight. She is not ill-appearing.  HENT:     Head: Normocephalic and atraumatic.  Eyes:     Pupils: Pupils are equal, round, and reactive to light.  Cardiovascular:     Rate and Rhythm: Normal rate and regular rhythm.  Pulmonary:     Effort: Pulmonary effort is normal. No respiratory distress.  Neurological:     Mental Status: She is alert and oriented to person, place, and time.  Psychiatric:        Mood and Affect: Mood normal.        Behavior: Behavior normal.        Assessment/Plan: 1. Essential hypertension BP stable, continue prn dose of amlodipine as prescribed.   2. B12 deficiency Administered in office today - cyanocobalamin (VITAMIN B12) injection 1,000 mcg  3. Encounter for smoking cessation counseling Discussed smoking cessation, is working on this without assistance, using small dose of nicotine via vaping to quit cigarettes and then plans to wean self off of vaping after that. She has tried other therapy modalities in the past and has not found a therapy or medication that has been successful for her.    General Counseling: Phyllis Murphy verbalizes understanding of the findings of todays visit and  agrees with plan of treatment. I have discussed any further diagnostic evaluation that may be needed or ordered today. We also reviewed her medications today. she has been encouraged to call the office with any questions or concerns that should arise related to todays visit.    No orders of the defined types were placed in this encounter.   Meds ordered this encounter  Medications   cyanocobalamin (VITAMIN B12) injection 1,000 mcg    Return if symptoms worsen or fail to improve, for declined, patient will call, moving to  [place] in near future, not sure what she will do for PCP yet.   Total time spent:30 Minutes Time spent includes review of chart, medications, test results, and follow up plan with the patient.    Controlled Substance Database was reviewed by me.  This patient was seen by Sallyanne Kuster, FNP-C in collaboration with Dr. Beverely Risen as a part of collaborative care agreement.   Timotheus Salm R. Tedd Sias, MSN, FNP-C Internal medicine

## 2022-07-21 ENCOUNTER — Ambulatory Visit: Payer: PPO | Admitting: Oncology

## 2022-07-21 ENCOUNTER — Ambulatory Visit: Payer: PPO

## 2022-07-25 ENCOUNTER — Encounter: Payer: Self-pay | Admitting: Nurse Practitioner

## 2022-07-25 ENCOUNTER — Other Ambulatory Visit: Payer: Self-pay | Admitting: Internal Medicine

## 2022-07-25 DIAGNOSIS — F458 Other somatoform disorders: Secondary | ICD-10-CM

## 2022-07-25 DIAGNOSIS — F410 Panic disorder [episodic paroxysmal anxiety] without agoraphobia: Secondary | ICD-10-CM

## 2022-07-28 MED ORDER — MIRTAZAPINE 30 MG PO TABS: 30.0000 mg | ORAL_TABLET | Freq: Every day | ORAL | 3 refills | Status: DC

## 2022-08-10 ENCOUNTER — Ambulatory Visit: Payer: PPO | Admitting: Nurse Practitioner

## 2022-09-02 ENCOUNTER — Other Ambulatory Visit: Payer: Self-pay | Admitting: Internal Medicine

## 2022-09-04 ENCOUNTER — Encounter: Payer: Self-pay | Admitting: Nurse Practitioner

## 2022-09-04 MED ORDER — TRAZODONE HCL 50 MG PO TABS: ORAL_TABLET | ORAL | 0 refills | Status: DC

## 2022-10-13 DIAGNOSIS — M542 Cervicalgia: Secondary | ICD-10-CM | POA: Diagnosis not present

## 2022-10-13 DIAGNOSIS — M5416 Radiculopathy, lumbar region: Secondary | ICD-10-CM | POA: Diagnosis not present

## 2022-10-13 DIAGNOSIS — G894 Chronic pain syndrome: Secondary | ICD-10-CM | POA: Diagnosis not present

## 2022-10-13 DIAGNOSIS — M47812 Spondylosis without myelopathy or radiculopathy, cervical region: Secondary | ICD-10-CM | POA: Diagnosis not present

## 2022-10-13 DIAGNOSIS — M545 Low back pain, unspecified: Secondary | ICD-10-CM | POA: Diagnosis not present

## 2022-10-13 DIAGNOSIS — Z79899 Other long term (current) drug therapy: Secondary | ICD-10-CM | POA: Diagnosis not present

## 2022-10-13 DIAGNOSIS — M5412 Radiculopathy, cervical region: Secondary | ICD-10-CM | POA: Diagnosis not present

## 2022-11-03 ENCOUNTER — Other Ambulatory Visit: Payer: Self-pay | Admitting: Internal Medicine

## 2022-11-03 DIAGNOSIS — I10 Essential (primary) hypertension: Secondary | ICD-10-CM

## 2022-12-08 ENCOUNTER — Encounter: Payer: Self-pay | Admitting: Nurse Practitioner

## 2022-12-08 ENCOUNTER — Other Ambulatory Visit: Payer: Self-pay | Admitting: Nurse Practitioner

## 2022-12-08 MED ORDER — IBUPROFEN 800 MG PO TABS: ORAL_TABLET | ORAL | 0 refills | Status: DC

## 2023-01-04 ENCOUNTER — Other Ambulatory Visit: Payer: Self-pay | Admitting: Nurse Practitioner

## 2023-01-11 DIAGNOSIS — M5412 Radiculopathy, cervical region: Secondary | ICD-10-CM | POA: Diagnosis not present

## 2023-01-11 DIAGNOSIS — G894 Chronic pain syndrome: Secondary | ICD-10-CM | POA: Diagnosis not present

## 2023-01-11 DIAGNOSIS — M5416 Radiculopathy, lumbar region: Secondary | ICD-10-CM | POA: Diagnosis not present

## 2023-01-11 DIAGNOSIS — Z79899 Other long term (current) drug therapy: Secondary | ICD-10-CM | POA: Diagnosis not present

## 2023-01-11 DIAGNOSIS — M542 Cervicalgia: Secondary | ICD-10-CM | POA: Diagnosis not present

## 2023-01-11 DIAGNOSIS — M545 Low back pain, unspecified: Secondary | ICD-10-CM | POA: Diagnosis not present

## 2023-01-11 DIAGNOSIS — M47812 Spondylosis without myelopathy or radiculopathy, cervical region: Secondary | ICD-10-CM | POA: Diagnosis not present

## 2023-02-13 ENCOUNTER — Ambulatory Visit: Payer: PPO | Admitting: Nurse Practitioner

## 2023-02-13 ENCOUNTER — Ambulatory Visit (INDEPENDENT_AMBULATORY_CARE_PROVIDER_SITE_OTHER): Payer: PPO | Admitting: Nurse Practitioner

## 2023-02-13 ENCOUNTER — Encounter: Payer: Self-pay | Admitting: Nurse Practitioner

## 2023-02-13 VITALS — BP 138/88 | HR 98 | Temp 98.1°F | Resp 16 | Ht 64.0 in | Wt 127.4 lb

## 2023-02-13 DIAGNOSIS — Z0001 Encounter for general adult medical examination with abnormal findings: Secondary | ICD-10-CM | POA: Diagnosis not present

## 2023-02-13 DIAGNOSIS — D508 Other iron deficiency anemias: Secondary | ICD-10-CM | POA: Diagnosis not present

## 2023-02-13 DIAGNOSIS — N3001 Acute cystitis with hematuria: Secondary | ICD-10-CM

## 2023-02-13 DIAGNOSIS — R300 Dysuria: Secondary | ICD-10-CM | POA: Diagnosis not present

## 2023-02-13 DIAGNOSIS — R319 Hematuria, unspecified: Secondary | ICD-10-CM | POA: Diagnosis not present

## 2023-02-13 DIAGNOSIS — G4700 Insomnia, unspecified: Secondary | ICD-10-CM | POA: Diagnosis not present

## 2023-02-13 DIAGNOSIS — I10 Essential (primary) hypertension: Secondary | ICD-10-CM | POA: Diagnosis not present

## 2023-02-13 DIAGNOSIS — E559 Vitamin D deficiency, unspecified: Secondary | ICD-10-CM | POA: Diagnosis not present

## 2023-02-13 DIAGNOSIS — E538 Deficiency of other specified B group vitamins: Secondary | ICD-10-CM

## 2023-02-13 DIAGNOSIS — E782 Mixed hyperlipidemia: Secondary | ICD-10-CM

## 2023-02-13 DIAGNOSIS — N390 Urinary tract infection, site not specified: Secondary | ICD-10-CM | POA: Diagnosis not present

## 2023-02-13 LAB — POCT URINALYSIS DIPSTICK
Bilirubin, UA: NEGATIVE
Glucose, UA: NEGATIVE
Nitrite, UA: NEGATIVE
Protein, UA: NEGATIVE
Spec Grav, UA: <=1.005 — AB (ref 1.010–1.025)
Urobilinogen, UA: 0.2 E.U./dL
pH, UA: 6 (ref 5.0–8.0)

## 2023-02-13 MED ORDER — MIRTAZAPINE 30 MG PO TABS
30.0000 mg | ORAL_TABLET | Freq: Every day | ORAL | 3 refills | Status: DC
Start: 2023-02-13 — End: 2024-02-14

## 2023-02-13 MED ORDER — TRAZODONE HCL 50 MG PO TABS
ORAL_TABLET | ORAL | 3 refills | Status: DC
Start: 2023-02-13 — End: 2023-12-13

## 2023-02-13 MED ORDER — NITROFURANTOIN MONOHYD MACRO 100 MG PO CAPS
ORAL_CAPSULE | ORAL | 2 refills | Status: DC
Start: 2023-02-13 — End: 2023-06-22

## 2023-02-13 MED ORDER — AMLODIPINE BESYLATE 10 MG PO TABS
10.0000 mg | ORAL_TABLET | Freq: Every day | ORAL | 3 refills | Status: DC
Start: 2023-02-13 — End: 2023-12-13

## 2023-02-13 MED ORDER — CIPROFLOXACIN HCL 500 MG PO TABS
500.0000 mg | ORAL_TABLET | Freq: Two times a day (BID) | ORAL | 0 refills | Status: AC
Start: 2023-02-13 — End: 2023-02-18

## 2023-02-13 NOTE — Progress Notes (Signed)
[facility] [address] [place], [place] [address]  Internal MEDICINE  Office Visit Note  Patient Name: Phyllis Murphy  [id number]  [id number]  Date of Service: 02/13/2023  Chief Complaint  Patient presents with   Hypertension   Medicare Wellness    HPI Phyllis Murphy presents for an annual well visit and physical exam.  Well-appearing 57 y.o. female with hypertension  Routine CRC screening: wants colonoscopy next year  Routine mammogram: deferred to next year  Labs: due for routine labs  New or worsening pain: headache -- migraine forgot her medication today.  Other concerns: gets UTI after sexual activity. Used to take a preventive antibiotic after intercourse  to prevent a UTI.    Current Medication: Outpatient Encounter Medications as of 02/13/2023  Medication Sig Note   amLODipine (NORVASC) 2.5 MG tablet Take 1 tablet (2.5 mg total) by mouth daily as needed (elevated BP in the evening >140/90).    ciprofloxacin (CIPRO) 500 MG tablet Take 1 tablet (500 mg total) by mouth 2 (two) times daily for 5 days. Take with food    cyclobenzaprine (FLEXERIL) 10 MG tablet Take 10 mg by mouth 4 (four) times daily.  04/07/2015: Received from: External Pharmacy   diphenhydrAMINE (BENADRYL) 25 mg capsule Take 25 mg by mouth every 6 (six) hours as needed.    ibuprofen (ADVIL) 800 MG tablet tAKE 1 TABLET BY MOUTH TWICE A DAY WITH FOOD AS NEEDED    nitrofurantoin, macrocrystal-monohydrate, (MACROBID) 100 MG capsule Take 1 capsule twice daily x2 days after sexual activity as needed    oxyCODONE-acetaminophen (PERCOCET) 7.5-325 MG tablet TAKE 1 TABLET BY MOUTH TWICE A DAY FOR TOTAL DOSE OF 7.5/5/7.5/5. TO FILL 01-15-16. 01/21/2016: Received from: External Pharmacy   [DISCONTINUED] amLODipine (NORVASC) 10 MG tablet Take 1 tablet (10 mg total) by mouth daily.    [DISCONTINUED] mirtazapine (REMERON) 30 MG tablet Take 1 tablet (30 mg total) by mouth at bedtime.    [DISCONTINUED] traZODone  (DESYREL) 50 MG tablet TAKE 1-2 TABLETS BY MOUTH DAILY AT BEDTIME AS NEEDED FOR INSOMNIA    amLODipine (NORVASC) 10 MG tablet Take 1 tablet (10 mg total) by mouth daily.    mirtazapine (REMERON) 30 MG tablet Take 1 tablet (30 mg total) by mouth at bedtime.    traZODone (DESYREL) 50 MG tablet TAKE 1-2 TABLETS BY MOUTH DAILY AT BEDTIME AS NEEDED FOR INSOMNIA    No facility-administered encounter medications on file as of 02/13/2023.    Surgical History: Past Surgical History:  Procedure Laterality Date   ABDOMINAL HYSTERECTOMY     AUGMENTATION MAMMAPLASTY Bilateral 2004   BREAST ENHANCEMENT SURGERY     CERVICAL FUSION     ELBOW SURGERY     two surgeries 6 yrs ago 2017   trigger finger release surgery Right 09/30/2021    Medical History: Past Medical History:  Diagnosis Date   Anxiety    DDD (degenerative disc disease), cervical    History of breast augmentation    2007   Hx of hysterectomy    Hypertension    IDA (iron deficiency anemia) 10/15/2021    Family History: Family History  Problem Relation Age of Onset   Brain cancer Mother    Hypertension Father    Diabetes Father    Stroke Father    Breast cancer Neg Hx     Social History   Socioeconomic History   Marital status: Married    Spouse name: Not on file   Number of children: Not  on file   Years of education: Not on file   Highest education level: Not on file  Occupational History   Occupation: [employer] started 2019  Tobacco Use   Smoking status: Some Days    Packs/day: 1.00    Years: 35.00    Additional pack years: 0.00    Total pack years: 35.00    Types: E-cigarettes, Cigarettes    Start date: 08/22/1980    Last attempt to quit: 11/20/2020    Years since quitting: 2.2   Smokeless tobacco: Never   Tobacco comments:    Vapes sometimes, pt reports no longer smokes cigarettes, but does vape some.   Vaping Use   Vaping Use: Every day   Devices: no nictonie  Substance and Sexual Activity   Alcohol use: No     Alcohol/week: 0.0 standard drinks of alcohol   Drug use: No   Sexual activity: Yes  Other Topics Concern   Not on file  Social History Narrative   ** Merged History Encounter **       Social Determinants of Health   Financial Resource Strain: Not on file  Food Insecurity: Not on file  Transportation Needs: Not on file  Physical Activity: Not on file  Stress: Not on file  Social Connections: Not on file  Intimate Partner Violence: Not on file      Review of Systems  Constitutional:  Negative for activity change, appetite change, chills, fatigue, fever and unexpected weight change.  HENT: Negative.  Negative for congestion, ear pain, rhinorrhea, sore throat and trouble swallowing.   Eyes: Negative.   Respiratory: Negative.  Negative for cough, chest tightness, shortness of breath and wheezing.   Cardiovascular: Negative.  Negative for chest pain.  Gastrointestinal: Negative.  Negative for abdominal pain, blood in stool, constipation, diarrhea, nausea and vomiting.  Endocrine: Negative.   Genitourinary: Negative.  Negative for difficulty urinating, dysuria, frequency, hematuria and urgency.  Musculoskeletal: Negative.  Negative for arthralgias, back pain, joint swelling, myalgias and neck pain.  Skin: Negative.  Negative for rash and wound.  Allergic/Immunologic: Negative.  Negative for immunocompromised state.  Neurological: Negative.  Negative for dizziness, seizures, numbness and headaches.  Hematological: Negative.   Psychiatric/Behavioral: Negative.  Negative for behavioral problems, self-injury and suicidal ideas. The patient is not nervous/anxious.     Vital Signs: BP 138/88 Comment: 160/88  Pulse 98 Comment: 109  Temp 98.1 F (36.7 C)   Resp 16   Ht 5\' 4"  (1.626 m)   Wt 127 lb 6.4 oz (57.8 kg)   SpO2 98%   BMI 21.87 kg/m    Physical Exam Vitals reviewed.  Constitutional:      General: She is not in acute distress.    Appearance: Normal appearance. She is  well-developed and normal weight. She is not ill-appearing or diaphoretic.  HENT:     Head: Normocephalic and atraumatic.     Right Ear: Tympanic membrane, ear canal and external ear normal.     Left Ear: Tympanic membrane, ear canal and external ear normal.     Nose: Nose normal. No congestion or rhinorrhea.     Mouth/Throat:     Mouth: Mucous membranes are moist.     Pharynx: Oropharynx is clear. No oropharyngeal exudate or posterior oropharyngeal erythema.  Eyes:     Extraocular Movements: Extraocular movements intact.     Conjunctiva/sclera: Conjunctivae normal.     Pupils: Pupils are equal, round, and reactive to light.  Neck:     Thyroid:  No thyromegaly.     Vascular: No JVD.     Trachea: No tracheal deviation.  Cardiovascular:     Rate and Rhythm: Normal rate and regular rhythm.     Pulses: Normal pulses.     Heart sounds: Normal heart sounds, S1 normal and S2 normal. No murmur heard.    No friction rub. No gallop.  Pulmonary:     Effort: Pulmonary effort is normal. No accessory muscle usage or respiratory distress.     Breath sounds: Normal breath sounds and air entry. No decreased breath sounds, wheezing or rales.  Chest:     Chest wall: No tenderness.     Comments: declined clinical breast exam. Last mammogram was normal and patient declined mammogram this year and said she will get it done next year.  Abdominal:     General: Abdomen is flat. Bowel sounds are normal. There is no distension.     Palpations: Abdomen is soft.     Tenderness: There is no abdominal tenderness.  Musculoskeletal:        General: Normal range of motion.     Cervical back: Normal range of motion and neck supple.     Right lower leg: No edema.     Left lower leg: No edema.  Lymphadenopathy:     Cervical: No cervical adenopathy.  Skin:    General: Skin is warm and dry.     Capillary Refill: Capillary refill takes less than 2 seconds.     Coloration: Skin is not jaundiced.     Findings: No  bruising.  Neurological:     Mental Status: She is alert and oriented to person, place, and time.     Cranial Nerves: No cranial nerve deficit.     Coordination: Coordination normal.     Gait: Gait normal.  Psychiatric:        Mood and Affect: Mood normal.        Behavior: Behavior normal.        Thought Content: Thought content normal.        Judgment: Judgment normal.        Assessment/Plan: 1. Encounter for routine adult health examination with abnormal findings Age-appropriate preventive screenings and vaccinations discussed, annual physical exam completed. Routine labs for health maintenance ordered, see below. Routine medication refills ordered as well. PHM updated.  - CBC with Differential/Platelet - CMP14+EGFR - Lipid Profile - Vitamin D (25 hydroxy) - B12 and Folate Panel - Iron, TIBC and Ferritin Panel - mirtazapine (REMERON) 30 MG tablet; Take 1 tablet (30 mg total) by mouth at bedtime.  Dispense: 90 tablet; Refill: 3 - traZODone (DESYREL) 50 MG tablet; TAKE 1-2 TABLETS BY MOUTH DAILY AT BEDTIME AS NEEDED FOR INSOMNIA  Dispense: 180 tablet; Refill: 3  2. Acute cystitis with hematuria Cipro prescribed for current UTI. Macrobid prescribed for post-coital UTI prophylaxis. Urine culture sent  - ciprofloxacin (CIPRO) 500 MG tablet; Take 1 tablet (500 mg total) by mouth 2 (two) times daily for 5 days. Take with food  Dispense: 10 tablet; Refill: 0 - nitrofurantoin, macrocrystal-monohydrate, (MACROBID) 100 MG capsule; Take 1 capsule twice daily x2 days after sexual activity as needed  Dispense: 60 capsule; Refill: 2 - POCT urinalysis dipstick - UA/M w/rflx Culture, Routine - CULTURE, URINE COMPREHENSIVE  3. Essential hypertension Continue amlodipine as prescribed. Routine labs ordered  - CBC with Differential/Platelet - CMP14+EGFR - Lipid Profile - Vitamin D (25 hydroxy) - B12 and Folate Panel - Iron, TIBC and Ferritin Panel -  amLODipine (NORVASC) 10 MG tablet; Take 1  tablet (10 mg total) by mouth daily.  Dispense: 90 tablet; Refill: 3  4. Other iron deficiency anemia Routine labs ordered  - CBC with Differential/Platelet - B12 and Folate Panel - Iron, TIBC and Ferritin Panel  5. Mixed hyperlipidemia Routine labs ordered  - CMP14+EGFR - Lipid Profile  6. Vitamin D deficiency Routine lab ordered - Vitamin D (25 hydroxy)  7. B12 deficiency Routine lab ordered  - B12 and Folate Panel  8. Dysuria Urinalysis positive for moderate blood and moderate leukocytes. Urine culture sent. Macrobid prescribed for post-coital UTI prophylaxis.  - nitrofurantoin, macrocrystal-monohydrate, (MACROBID) 100 MG capsule; Take 1 capsule twice daily x2 days after sexual activity as needed  Dispense: 60 capsule; Refill: 2 - POCT urinalysis dipstick - UA/M w/rflx Culture, Routine     General Counseling: [place]ara verbalizes understanding of the findings of todays visit and agrees with plan of treatment. I have discussed any further diagnostic evaluation that may be needed or ordered today. We also reviewed her medications today. she has been encouraged to call the office with any questions or concerns that should arise related to todays visit.    Orders Placed This Encounter  Procedures   CULTURE, URINE COMPREHENSIVE   CBC with Differential/Platelet   CMP14+EGFR   Lipid Profile   Vitamin D (25 hydroxy)   B12 and Folate Panel   Iron, TIBC and Ferritin Panel   UA/M w/rflx Culture, Routine   POCT urinalysis dipstick    Meds ordered this encounter  Medications   ciprofloxacin (CIPRO) 500 MG tablet    Sig: Take 1 tablet (500 mg total) by mouth 2 (two) times daily for 5 days. Take with food    Dispense:  10 tablet    Refill:  0   amLODipine (NORVASC) 10 MG tablet    Sig: Take 1 tablet (10 mg total) by mouth daily.    Dispense:  90 tablet    Refill:  3   mirtazapine (REMERON) 30 MG tablet    Sig: Take 1 tablet (30 mg total) by mouth at bedtime.    Dispense:   90 tablet    Refill:  3    Please fill new script today   traZODone (DESYREL) 50 MG tablet    Sig: TAKE 1-2 TABLETS BY MOUTH DAILY AT BEDTIME AS NEEDED FOR INSOMNIA    Dispense:  180 tablet    Refill:  3   nitrofurantoin, macrocrystal-monohydrate, (MACROBID) 100 MG capsule    Sig: Take 1 capsule twice daily x2 days after sexual activity as needed    Dispense:  60 capsule    Refill:  2    Return in about 6 months (around 08/15/2023) for F/U, Royalty Fakhouri PCP.   Total time spent:30 Minutes Time spent includes review of chart, medications, test results, and follow up plan with the patient.   [place] Controlled Substance Database was reviewed by me.  This patient was seen by Sallyanne Kuster, FNP-C in collaboration with Dr. Beverely Risen as a part of collaborative care agreement.  Jordon Bourquin R. Tedd Sias, MSN, FNP-C Internal medicine

## 2023-02-14 LAB — CBC WITH DIFFERENTIAL/PLATELET
Basophils Absolute: 0 10*3/uL (ref 0.0–0.2)
Basos: 1 %
EOS (ABSOLUTE): 0 10*3/uL (ref 0.0–0.4)
Eos: 1 %
Hematocrit: 36.3 % (ref 34.0–46.6)
Hemoglobin: 12 g/dL (ref 11.1–15.9)
Immature Grans (Abs): 0 10*3/uL (ref 0.0–0.1)
Immature Granulocytes: 0 %
Lymphocytes Absolute: 1.5 10*3/uL (ref 0.7–3.1)
Lymphs: 23 %
MCH: 30.5 pg (ref 26.6–33.0)
MCHC: 33.1 g/dL (ref 31.5–35.7)
MCV: 92 fL (ref 79–97)
Monocytes Absolute: 0.4 10*3/uL (ref 0.1–0.9)
Monocytes: 6 %
Neutrophils Absolute: 4.5 10*3/uL (ref 1.4–7.0)
Neutrophils: 69 %
Platelets: 398 10*3/uL (ref 150–450)
RBC: 3.94 x10E6/uL (ref 3.77–5.28)
RDW: 12.6 % (ref 11.7–15.4)
WBC: 6.5 10*3/uL (ref 3.4–10.8)

## 2023-02-14 LAB — CMP14+EGFR
ALT: 9 IU/L (ref 0–32)
AST: 11 IU/L (ref 0–40)
Albumin: 4.8 g/dL (ref 3.8–4.9)
Alkaline Phosphatase: 79 IU/L (ref 44–121)
BUN/Creatinine Ratio: 12 (ref 9–23)
BUN: 9 mg/dL (ref 6–24)
Bilirubin Total: 0.2 mg/dL (ref 0.0–1.2)
CO2: 24 mmol/L (ref 20–29)
Calcium: 9.4 mg/dL (ref 8.7–10.2)
Chloride: 101 mmol/L (ref 96–106)
Creatinine, Ser: 0.73 mg/dL (ref 0.57–1.00)
Globulin, Total: 2.2 g/dL (ref 1.5–4.5)
Glucose: 85 mg/dL (ref 70–99)
Potassium: 4.7 mmol/L (ref 3.5–5.2)
Sodium: 139 mmol/L (ref 134–144)
Total Protein: 7 g/dL (ref 6.0–8.5)
eGFR: 98 mL/min/{1.73_m2} (ref >59)

## 2023-02-14 LAB — LIPID PANEL
Chol/HDL Ratio: 2.4 ratio (ref 0.0–4.4)
Cholesterol, Total: 221 mg/dL — ABNORMAL HIGH (ref 100–199)
HDL: 91 mg/dL (ref >39)
LDL Chol Calc (NIH): 119 mg/dL — ABNORMAL HIGH (ref 0–99)
Triglycerides: 62 mg/dL (ref 0–149)
VLDL Cholesterol Cal: 11 mg/dL (ref 5–40)

## 2023-02-14 LAB — URINE CULTURE, REFLEX

## 2023-02-14 LAB — IRON,TIBC AND FERRITIN PANEL
Ferritin: 55 ng/mL (ref 15–150)
Iron Saturation: 20 % (ref 15–55)
Iron: 73 ug/dL (ref 27–159)
Total Iron Binding Capacity: 365 ug/dL (ref 250–450)
UIBC: 292 ug/dL (ref 131–425)

## 2023-02-14 LAB — UA/M W/RFLX CULTURE, ROUTINE
Bilirubin, UA: NEGATIVE
pH, UA: 7 (ref 5.0–7.5)

## 2023-02-14 LAB — B12 AND FOLATE PANEL
Folate: 8.4 ng/mL (ref >3.0)
Vitamin B-12: 248 pg/mL (ref 232–1245)

## 2023-02-14 LAB — MICROSCOPIC EXAMINATION: Epithelial Cells (non renal): NONE SEEN /HPF (ref 0–10)

## 2023-02-14 LAB — VITAMIN D 25 HYDROXY (VIT D DEFICIENCY, FRACTURES): Vit D, 25-Hydroxy: 70 ng/mL (ref 30.0–100.0)

## 2023-02-16 LAB — UA/M W/RFLX CULTURE, ROUTINE
Ketones, UA: NEGATIVE
Protein,UA: NEGATIVE
Specific Gravity, UA: 1.006 (ref 1.005–1.030)

## 2023-02-16 LAB — MICROSCOPIC EXAMINATION: RBC, Urine: NONE SEEN /HPF (ref 0–2)

## 2023-02-17 LAB — CULTURE, URINE COMPREHENSIVE

## 2023-02-18 LAB — UA/M W/RFLX CULTURE, ROUTINE
Nitrite, UA: NEGATIVE
Urobilinogen, Ur: 0.2 mg/dL (ref 0.2–1.0)

## 2023-02-18 LAB — CULTURE, URINE COMPREHENSIVE

## 2023-02-19 LAB — URINE CULTURE, REFLEX

## 2023-02-19 LAB — UA/M W/RFLX CULTURE, ROUTINE: Glucose, UA: NEGATIVE

## 2023-02-19 LAB — MICROSCOPIC EXAMINATION: Casts: NONE SEEN /LPF

## 2023-04-12 DIAGNOSIS — M5412 Radiculopathy, cervical region: Secondary | ICD-10-CM | POA: Diagnosis not present

## 2023-04-12 DIAGNOSIS — M542 Cervicalgia: Secondary | ICD-10-CM | POA: Diagnosis not present

## 2023-04-12 DIAGNOSIS — M545 Low back pain, unspecified: Secondary | ICD-10-CM | POA: Diagnosis not present

## 2023-04-12 DIAGNOSIS — G894 Chronic pain syndrome: Secondary | ICD-10-CM | POA: Diagnosis not present

## 2023-04-12 DIAGNOSIS — M5416 Radiculopathy, lumbar region: Secondary | ICD-10-CM | POA: Diagnosis not present

## 2023-04-12 DIAGNOSIS — Z79899 Other long term (current) drug therapy: Secondary | ICD-10-CM | POA: Diagnosis not present

## 2023-04-12 DIAGNOSIS — M47812 Spondylosis without myelopathy or radiculopathy, cervical region: Secondary | ICD-10-CM | POA: Diagnosis not present

## 2023-04-29 ENCOUNTER — Other Ambulatory Visit: Payer: Self-pay | Admitting: Nurse Practitioner

## 2023-04-29 DIAGNOSIS — N3001 Acute cystitis with hematuria: Secondary | ICD-10-CM

## 2023-04-29 DIAGNOSIS — R300 Dysuria: Secondary | ICD-10-CM

## 2023-06-08 ENCOUNTER — Telehealth: Payer: PPO | Admitting: Physician Assistant

## 2023-06-08 DIAGNOSIS — L235 Allergic contact dermatitis due to other chemical products: Secondary | ICD-10-CM

## 2023-06-08 MED ORDER — TRIAMCINOLONE ACETONIDE 0.1 % EX CREA
1.0000 | TOPICAL_CREAM | Freq: Two times a day (BID) | CUTANEOUS | 0 refills | Status: DC
Start: 2023-06-08 — End: 2023-06-22

## 2023-06-08 MED ORDER — PREDNISONE 10 MG PO TABS
ORAL_TABLET | ORAL | 0 refills | Status: AC
Start: 2023-06-08 — End: 2023-06-21

## 2023-06-08 NOTE — Patient Instructions (Signed)
  Phyllis Murphy, thank you for joining Piedad Climes, PA-C for today's virtual visit.  While this provider is not your primary care provider (PCP), if your PCP is located in our provider database this encounter information will be shared with them immediately following your visit.   A [facility] MyChart account gives you access to today's visit and all your visits, tests, and labs performed at [facility] " click here if you don't have a [facility] MyChart account or go to [email or web address]  Consent: (Patient) Phyllis Murphy provided verbal consent for this virtual visit at the beginning of the encounter.  Current Medications:  Current Outpatient Medications:    amLODipine (NORVASC) 10 MG tablet, Take 1 tablet (10 mg total) by mouth daily., Disp: 90 tablet, Rfl: 3   amLODipine (NORVASC) 2.5 MG tablet, Take 1 tablet (2.5 mg total) by mouth daily as needed (elevated BP in the evening >140/90)., Disp: 90 tablet, Rfl: 3   cyclobenzaprine (FLEXERIL) 10 MG tablet, Take 10 mg by mouth 4 (four) times daily. , Disp: , Rfl: 2   diphenhydrAMINE (BENADRYL) 25 mg capsule, Take 25 mg by mouth every 6 (six) hours as needed., Disp: , Rfl:    ibuprofen (ADVIL) 800 MG tablet, tAKE 1 TABLET BY MOUTH TWICE A DAY WITH FOOD AS NEEDED, Disp: 60 tablet, Rfl: 0   mirtazapine (REMERON) 30 MG tablet, Take 1 tablet (30 mg total) by mouth at bedtime., Disp: 90 tablet, Rfl: 3   nitrofurantoin, macrocrystal-monohydrate, (MACROBID) 100 MG capsule, Take 1 capsule twice daily x2 days after sexual activity as needed, Disp: 60 capsule, Rfl: 2   oxyCODONE-acetaminophen (PERCOCET) 7.5-325 MG tablet, TAKE 1 TABLET BY MOUTH TWICE A DAY FOR TOTAL DOSE OF 7.5/5/7.5/5. TO FILL 01-15-16., Disp: , Rfl: 0   traZODone (DESYREL) 50 MG tablet, TAKE 1-2 TABLETS BY MOUTH DAILY AT BEDTIME AS NEEDED FOR INSOMNIA, Disp: 180 tablet, Rfl: 3   Medications ordered in this encounter:  No orders of the defined types were  placed in this encounter.    *If you need refills on other medications prior to your next appointment, please contact your pharmacy*  Follow-Up: Call back or seek an in-person evaluation if the symptoms worsen or if the condition fails to improve as anticipated.  [facility] [phone]  Other Instructions Please keep skin clean and dry. Avoid use of the offending soap. You can take an OTC antihistamine like Claritin or Xyzal. Start the prescribed medications as directed. If symptoms are not resolving or you note any new/worsening symptoms despite treatment, we would want you to seek an in-person evaluation.    If you have been instructed to have an in-person evaluation today at a local Urgent Care facility, please use the link below. It will take you to a list of all of our available [facility], including address, phone number and hours of operation. Please do not delay care.  [facility]  If you or a family member do not have a primary care provider, use the link below to schedule a visit and establish care. When you choose a [facility] primary care physician or advanced practice provider, you gain a long-term partner in health. Find a Primary Care Provider  Learn more about [facility]'s in-office and virtual care options:  - Get Care Now

## 2023-06-08 NOTE — Progress Notes (Signed)
Virtual Visit Consent   Phyllis Murphy, you are scheduled for a virtual visit with a [facility] provider today. Just as with appointments in the office, your consent must be obtained to participate. Your consent will be active for this visit and any virtual visit you may have with one of our providers in the next 365 days. If you have a MyChart account, a copy of this consent can be sent to you electronically.  As this is a virtual visit, video technology does not allow for your provider to perform a traditional examination. This may limit your provider's ability to fully assess your condition. If your provider identifies any concerns that need to be evaluated in person or the need to arrange testing (such as labs, EKG, etc.), we will make arrangements to do so. Although advances in technology are sophisticated, we cannot ensure that it will always work on either your end or our end. If the connection with a video visit is poor, the visit may have to be switched to a telephone visit. With either a video or telephone visit, we are not always able to ensure that we have a secure connection.  By engaging in this virtual visit, you consent to the provision of healthcare and authorize for your insurance to be billed (if applicable) for the services provided during this visit. Depending on your insurance coverage, you may receive a charge related to this service.  I need to obtain your verbal consent now. Are you willing to proceed with your visit today? MALARIE TAPPEN has provided verbal consent on 06/08/2023 for a virtual visit (video or telephone). Phyllis Murphy, [place]  Date: 06/08/2023 7:45 AM  Virtual Visit via Video Note   I, Phyllis Murphy, connected with  LEVONNE CARRERAS  ([id number], 1965/09/02) on 06/08/23 at  7:45 AM EDT by a video-enabled telemedicine application and verified that I am speaking with the correct person using two identifiers.  Location: Patient: Virtual Visit  Location Patient: Home Provider: Virtual Visit Location Provider: Home Office   I discussed the limitations of evaluation and management by telemedicine and the availability of in person appointments. The patient expressed understanding and agreed to proceed.    History of Present Illness: Phyllis Murphy is a 57 y.o. who identifies as a female who was assigned female at birth, and is being seen today for possible contact dermatitis of her hands. Notes within the past 5 days having red bumps over back of hand sand between the fingers after using a new dish soap. Notes history of allergy to dawn dish soap but has not had issue with other brands before. Denies any other change to products used. Denies noted rash elsewhere. Has been applying Benadryl cream/spray to the area with minimal relief.    HPI: HPI  Problems:  Patient Active Problem List   Diagnosis Date Noted   IDA (iron deficiency anemia) 10/15/2021   Generalized anxiety disorder with panic attacks 02/06/2021   Idiopathic gout 11/21/2018   Drug-induced constipation 11/14/2018   Ganglion cyst of dorsum of left wrist 06/20/2018   Mixed incontinence urge and stress 06/20/2018   Tinea versicolor 06/20/2018   Mixed hyperlipidemia 07/13/2015   Tobacco abuse 07/13/2015   Cervical spinal stenosis 04/04/2012   Essential hypertension 04/04/2012   Back ache 02/03/2012   Hand numbness 02/03/2012    Allergies:  Allergies  Allergen Reactions   Esmolol Anaphylaxis   Latex Anaphylaxis   Bacitracin Hives and Itching   Other Hives  Dermabond   Sulfa Antibiotics Hives and Itching   Medications:  Current Outpatient Medications:    predniSONE (DELTASONE) 10 MG tablet, Take 4 tablets (40 mg total) by mouth daily with breakfast for 4 days, THEN 3 tablets (30 mg total) daily with breakfast for 4 days, THEN 2 tablets (20 mg total) daily with breakfast for 3 days, THEN 1 tablet (10 mg total) daily with breakfast for 3 days., Disp: 37 tablet,  Rfl: 0   triamcinolone cream (KENALOG) 0.1 %, Apply 1 Application topically 2 (two) times daily., Disp: 30 g, Rfl: 0   amLODipine (NORVASC) 10 MG tablet, Take 1 tablet (10 mg total) by mouth daily., Disp: 90 tablet, Rfl: 3   cyclobenzaprine (FLEXERIL) 10 MG tablet, Take 10 mg by mouth 4 (four) times daily. , Disp: , Rfl: 2   diphenhydrAMINE (BENADRYL) 25 mg capsule, Take 25 mg by mouth every 6 (six) hours as needed., Disp: , Rfl:    ibuprofen (ADVIL) 800 MG tablet, tAKE 1 TABLET BY MOUTH TWICE A DAY WITH FOOD AS NEEDED, Disp: 60 tablet, Rfl: 0   mirtazapine (REMERON) 30 MG tablet, Take 1 tablet (30 mg total) by mouth at bedtime., Disp: 90 tablet, Rfl: 3   nitrofurantoin, macrocrystal-monohydrate, (MACROBID) 100 MG capsule, Take 1 capsule twice daily x2 days after sexual activity as needed, Disp: 60 capsule, Rfl: 2   oxyCODONE-acetaminophen (PERCOCET) 7.5-325 MG tablet, TAKE 1 TABLET BY MOUTH TWICE A DAY FOR TOTAL DOSE OF 7.5/5/7.5/5. TO FILL 01-15-16., Disp: , Rfl: 0   traZODone (DESYREL) 50 MG tablet, TAKE 1-2 TABLETS BY MOUTH DAILY AT BEDTIME AS NEEDED FOR INSOMNIA, Disp: 180 tablet, Rfl: 3  Observations/Objective: Patient is well-developed, well-nourished in no acute distress.  Resting comfortably  at home.  Head is normocephalic, atraumatic.  No labored breathing.  Speech is clear and coherent with logical content.  Patient is alert and oriented at baseline.  Papulovesicular rash scattered over backs of hands with eczematous patches between the fingers and on anterior wrist  Assessment and Plan: 1. Allergic dermatitis due to other chemical product - triamcinolone cream (KENALOG) 0.1 %; Apply 1 Application topically 2 (two) times daily.  Dispense: 30 g; Refill: 0 - predniSONE (DELTASONE) 10 MG tablet; Take 4 tablets (40 mg total) by mouth daily with breakfast for 4 days, THEN 3 tablets (30 mg total) daily with breakfast for 4 days, THEN 2 tablets (20 mg total) daily with breakfast for 3  days, THEN 1 tablet (10 mg total) daily with breakfast for 3 days.  Dispense: 37 tablet; Refill: 0  Start Kenalog topically. Giving severity, will add on prednisone taper. OTC antihistamine recommended. Supportive measures reviewed. Follow-up in person if not resolving or any new/worsening symptoms despite treatment.  Follow Up Instructions: I discussed the assessment and treatment plan with the patient. The patient was provided an opportunity to ask questions and all were answered. The patient agreed with the plan and demonstrated an understanding of the instructions.  A copy of instructions were sent to the patient via MyChart unless otherwise noted below.   The patient was advised to call back or seek an in-person evaluation if the symptoms worsen or if the condition fails to improve as anticipated.    Phyllis Climes, PA-C

## 2023-06-22 ENCOUNTER — Encounter: Payer: Self-pay | Admitting: Nurse Practitioner

## 2023-06-22 ENCOUNTER — Ambulatory Visit: Payer: PPO | Admitting: Nurse Practitioner

## 2023-06-22 VITALS — BP 128/88 | HR 107 | Temp 98.6°F | Resp 16 | Ht 64.0 in | Wt 131.2 lb

## 2023-06-22 DIAGNOSIS — Z5181 Encounter for therapeutic drug level monitoring: Secondary | ICD-10-CM | POA: Diagnosis not present

## 2023-06-22 DIAGNOSIS — E559 Vitamin D deficiency, unspecified: Secondary | ICD-10-CM | POA: Diagnosis not present

## 2023-06-22 DIAGNOSIS — I10 Essential (primary) hypertension: Secondary | ICD-10-CM

## 2023-06-22 DIAGNOSIS — Z79899 Other long term (current) drug therapy: Secondary | ICD-10-CM | POA: Diagnosis not present

## 2023-06-22 DIAGNOSIS — E538 Deficiency of other specified B group vitamins: Secondary | ICD-10-CM | POA: Diagnosis not present

## 2023-06-22 DIAGNOSIS — M5412 Radiculopathy, cervical region: Secondary | ICD-10-CM | POA: Diagnosis not present

## 2023-06-22 DIAGNOSIS — L235 Allergic contact dermatitis due to other chemical products: Secondary | ICD-10-CM | POA: Diagnosis not present

## 2023-06-22 DIAGNOSIS — M47812 Spondylosis without myelopathy or radiculopathy, cervical region: Secondary | ICD-10-CM | POA: Diagnosis not present

## 2023-06-22 DIAGNOSIS — G894 Chronic pain syndrome: Secondary | ICD-10-CM | POA: Diagnosis not present

## 2023-06-22 DIAGNOSIS — D508 Other iron deficiency anemias: Secondary | ICD-10-CM

## 2023-06-22 DIAGNOSIS — M545 Low back pain, unspecified: Secondary | ICD-10-CM | POA: Diagnosis not present

## 2023-06-22 DIAGNOSIS — E782 Mixed hyperlipidemia: Secondary | ICD-10-CM | POA: Diagnosis not present

## 2023-06-22 DIAGNOSIS — M5416 Radiculopathy, lumbar region: Secondary | ICD-10-CM | POA: Diagnosis not present

## 2023-06-22 DIAGNOSIS — M791 Myalgia, unspecified site: Secondary | ICD-10-CM | POA: Diagnosis not present

## 2023-06-22 MED ORDER — IBUPROFEN 800 MG PO TABS: ORAL_TABLET | ORAL | 0 refills | Status: DC

## 2023-06-22 MED ORDER — TRIAMCINOLONE ACETONIDE 0.1 % EX CREA: 1.0000 | TOPICAL_CREAM | Freq: Two times a day (BID) | CUTANEOUS | 2 refills | Status: AC | PRN

## 2023-06-22 NOTE — Progress Notes (Cosign Needed)
[facility] [address] [place], [place] [address]  Internal MEDICINE  Office Visit Note  Patient Name: Phyllis Murphy  [id number]  [id number]  Date of Service: 06/22/2023  Chief Complaint  Patient presents with   Hypertension   Follow-up    F/u labs results    HPI Phyllis Murphy presents for a follow-up visit for lab results and refills.  Improved vitamin D Improved cholesterol Improved iron panel Low b12 but she already started a supplement for that  Allergic to dawn dish soap, had a rash with a different soap.    Current Medication: Outpatient Encounter Medications as of 06/22/2023  Medication Sig Note   amLODipine (NORVASC) 10 MG tablet Take 1 tablet (10 mg total) by mouth daily.    cyclobenzaprine (FLEXERIL) 10 MG tablet Take 10 mg by mouth 4 (four) times daily.  04/07/2015: Received from: External Pharmacy   diphenhydrAMINE (BENADRYL) 25 mg capsule Take 25 mg by mouth every 6 (six) hours as needed.    mirtazapine (REMERON) 30 MG tablet Take 1 tablet (30 mg total) by mouth at bedtime.    oxyCODONE-acetaminophen (PERCOCET) 7.5-325 MG tablet TAKE 1 TABLET BY MOUTH TWICE A DAY FOR TOTAL DOSE OF 7.5/5/7.5/5. TO FILL 01-15-16. 01/21/2016: Received from: External Pharmacy   traZODone (DESYREL) 50 MG tablet TAKE 1-2 TABLETS BY MOUTH DAILY AT BEDTIME AS NEEDED FOR INSOMNIA    [DISCONTINUED] ibuprofen (ADVIL) 800 MG tablet tAKE 1 TABLET BY MOUTH TWICE A DAY WITH FOOD AS NEEDED    [DISCONTINUED] nitrofurantoin, macrocrystal-monohydrate, (MACROBID) 100 MG capsule Take 1 capsule twice daily x2 days after sexual activity as needed    [DISCONTINUED] triamcinolone cream (KENALOG) 0.1 % Apply 1 Application topically 2 (two) times daily.    ibuprofen (ADVIL) 800 MG tablet tAKE 1 TABLET BY MOUTH TWICE A DAY WITH FOOD AS NEEDED    triamcinolone cream (KENALOG) 0.1 % Apply 1 Application topically 2 (two) times daily as needed (rash/itching).    No facility-administered encounter  medications on file as of 06/22/2023.    Surgical History: Past Surgical History:  Procedure Laterality Date   ABDOMINAL HYSTERECTOMY     AUGMENTATION MAMMAPLASTY Bilateral 2004   BREAST ENHANCEMENT SURGERY     CERVICAL FUSION     ELBOW SURGERY     two surgeries 6 yrs ago 2017   trigger finger release surgery Right 09/30/2021    Medical History: Past Medical History:  Diagnosis Date   Anxiety    DDD (degenerative disc disease), cervical    History of breast augmentation    2007   Hx of hysterectomy    Hypertension    IDA (iron deficiency anemia) 10/15/2021    Family History: Family History  Problem Relation Age of Onset   Brain cancer Mother    Hypertension Father    Diabetes Father    Stroke Father    Breast cancer Neg Hx     Social History   Socioeconomic History   Marital status: Married    Spouse name: Not on file   Number of children: Not on file   Years of education: Not on file   Highest education level: Not on file  Occupational History   Occupation: [employer] started 2019  Tobacco Use   Smoking status: Every Day    Current packs/day: 0.00    Average packs/day: 1 pack/day for 40.2 years (40.2 ttl pk-yrs)    Types: E-cigarettes, Cigarettes    Start date: 08/22/1980    Last attempt to quit:  11/20/2020    Years since quitting: 2.5   Smokeless tobacco: Never   Tobacco comments:    Vapes sometimes, pt reports no longer smokes cigarettes, but does vape some.  Back to smoking  Vaping Use   Vaping status: Every Day   Devices: no nictonie  Substance and Sexual Activity   Alcohol use: No    Alcohol/week: 0.0 standard drinks of alcohol   Drug use: No   Sexual activity: Yes  Other Topics Concern   Not on file  Social History Narrative   ** Merged History Encounter **       Social Determinants of Health   Financial Resource Strain: Not on file  Food Insecurity: Not on file  Transportation Needs: Not on file  Physical Activity: Not on file  Stress: Not on  file  Social Connections: Not on file  Intimate Partner Violence: Not on file      Review of Systems  Constitutional:  Positive for appetite change. Negative for chills, fatigue and fever.  HENT:  Negative for congestion, mouth sores and postnasal drip.   Respiratory: Negative.  Negative for cough, chest tightness, shortness of breath and wheezing.   Cardiovascular: Negative.  Negative for chest pain and palpitations.  Gastrointestinal: Negative.   Genitourinary:  Negative for flank pain.  Musculoskeletal: Negative.   Psychiatric/Behavioral:  Negative for self-injury, sleep disturbance and suicidal ideas. The patient is nervous/anxious (increased some due to smoking cessation and decrease intake of nicotine.).     Vital Signs: BP 128/88   Pulse (!) 107   Temp 98.6 F (37 C)   Resp 16   Ht 5\' 4"  (1.626 m)   Wt 131 lb 3.2 oz (59.5 kg)   SpO2 99%   BMI 22.52 kg/m    Physical Exam Vitals reviewed.  Constitutional:      General: She is not in acute distress.    Appearance: Normal appearance. She is normal weight. She is not ill-appearing.  HENT:     Head: Normocephalic and atraumatic.  Eyes:     Pupils: Pupils are equal, round, and reactive to light.  Cardiovascular:     Rate and Rhythm: Normal rate and regular rhythm.  Pulmonary:     Effort: Pulmonary effort is normal. No respiratory distress.  Neurological:     Mental Status: She is alert and oriented to person, place, and time.  Psychiatric:        Mood and Affect: Mood normal.        Behavior: Behavior normal.        Assessment/Plan: 1. Allergic dermatitis due to other chemical product Topical steroid provided for now and in case this happens again in the future - triamcinolone cream (KENALOG) 0.1 %; Apply 1 Application topically 2 (two) times daily as needed (rash/itching).  Dispense: 45 g; Refill: 2  2. Essential hypertension (Primary) Stable, continue amlodipine as prescribed.   3. Other iron  deficiency anemia Continue to monitor periodically  4. Mixed hyperlipidemia Continue limiting red meat intake, increase lean proteins and take a fish oil supplement  5. Vitamin D deficiency Continue OTC vitamin D supplement.   6. B12 deficiency Continue OTC B12 supplement   General Counseling: Phyllis Murphy verbalizes understanding of the findings of todays visit and agrees with plan of treatment. I have discussed any further diagnostic evaluation that may be needed or ordered today. We also reviewed her medications today. she has been encouraged to call the office with any questions or concerns that should arise related  to todays visit.    No orders of the defined types were placed in this encounter.   Meds ordered this encounter  Medications   triamcinolone cream (KENALOG) 0.1 %    Sig: Apply 1 Application topically 2 (two) times daily as needed (rash/itching).    Dispense:  45 g    Refill:  2   ibuprofen (ADVIL) 800 MG tablet    Sig: tAKE 1 TABLET BY MOUTH TWICE A DAY WITH FOOD AS NEEDED    Dispense:  60 tablet    Refill:  0    Return for previously scheduled, CPE, Phyllis Murphy PCP.   Total time spent:30 Minutes Time spent includes review of chart, medications, test results, and follow up plan with the patient.   [place] Controlled Substance Database was reviewed by me.  This patient was seen by Sallyanne Kuster, FNP-C in collaboration with Dr. Beverely Risen as a part of collaborative care agreement.   Allannah Kempen R. Tedd Sias, MSN, FNP-C Internal medicine

## 2023-07-12 IMAGING — MG DIGITAL SCREENING BREAST BILAT IMPLANT W/ TOMO W/ CAD
9 of 14 series · 9 of 34 positions shown · non-contrast
Comparison: Previous exam(s).

CLINICAL DATA: Screening.

EXAM:
DIGITAL SCREENING BILATERAL MAMMOGRAM WITH IMPLANTS, CAD AND
TOMOSYNTHESIS
TECHNIQUE: Bilateral screening digital craniocaudal and mediolateral oblique
mammograms were obtained. Bilateral screening digital breast
tomosynthesis was performed. The images were evaluated with
computer-aided detection. Standard and/or implant displaced views
were performed.

[R MLO]
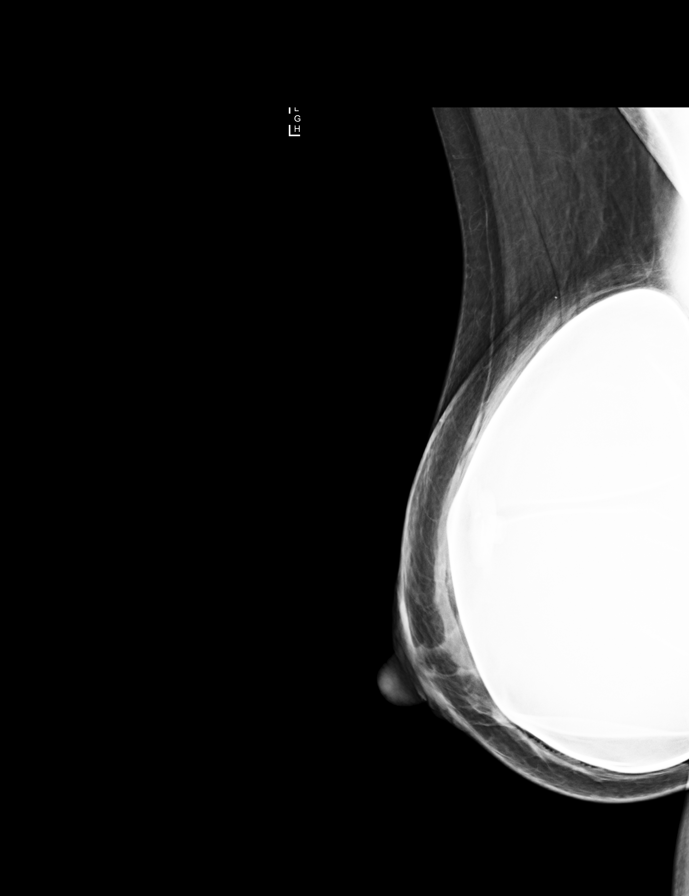

[R CC]
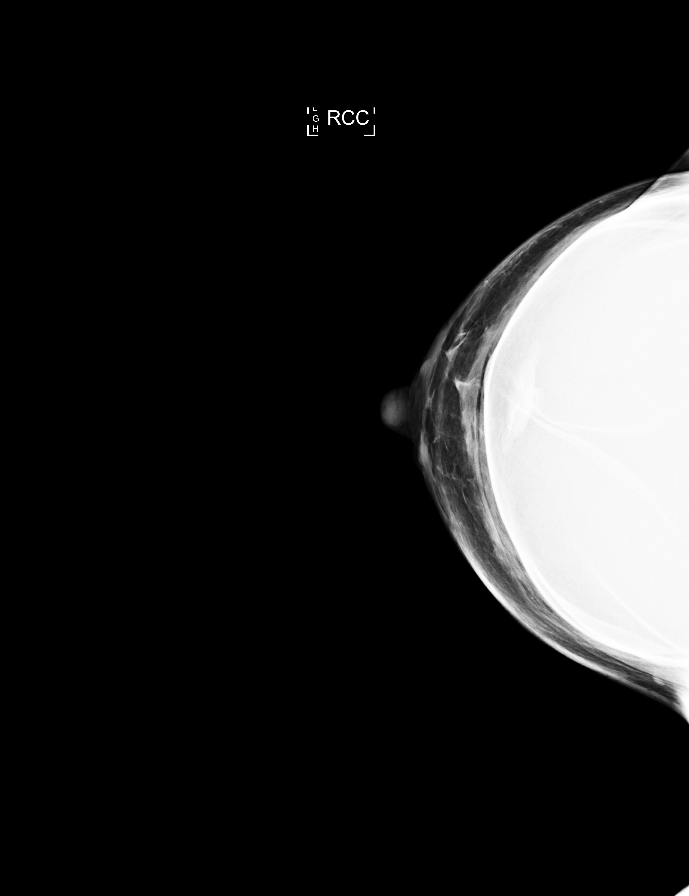

[L CC]
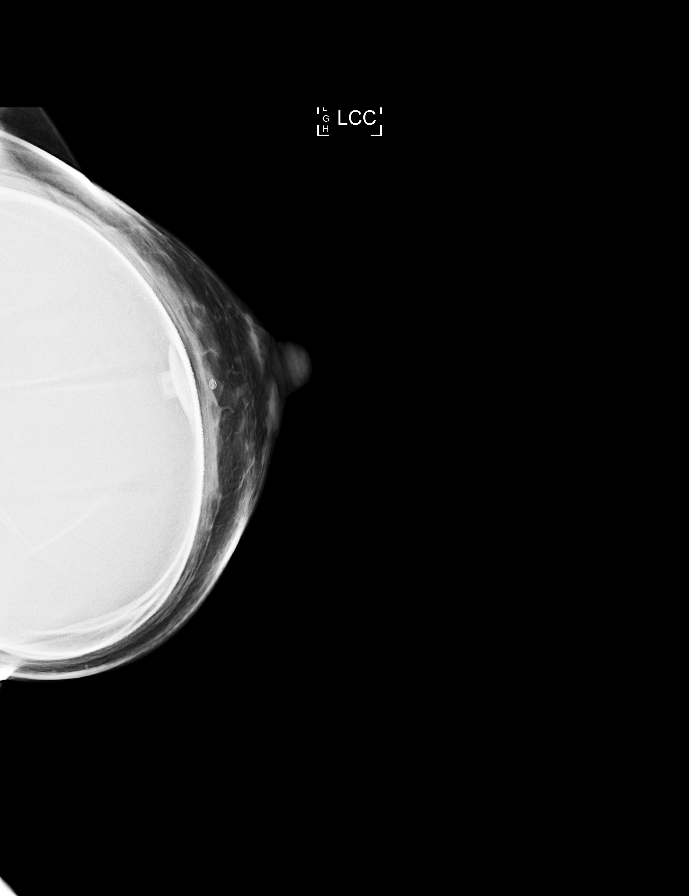

[L MLO]
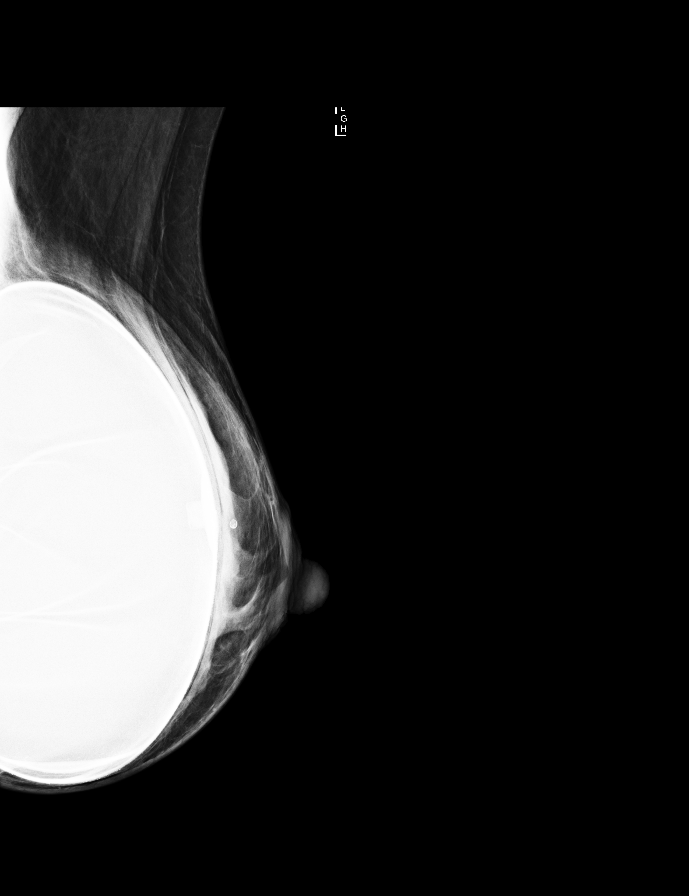

[R CC synth-2D (1 of 2)]
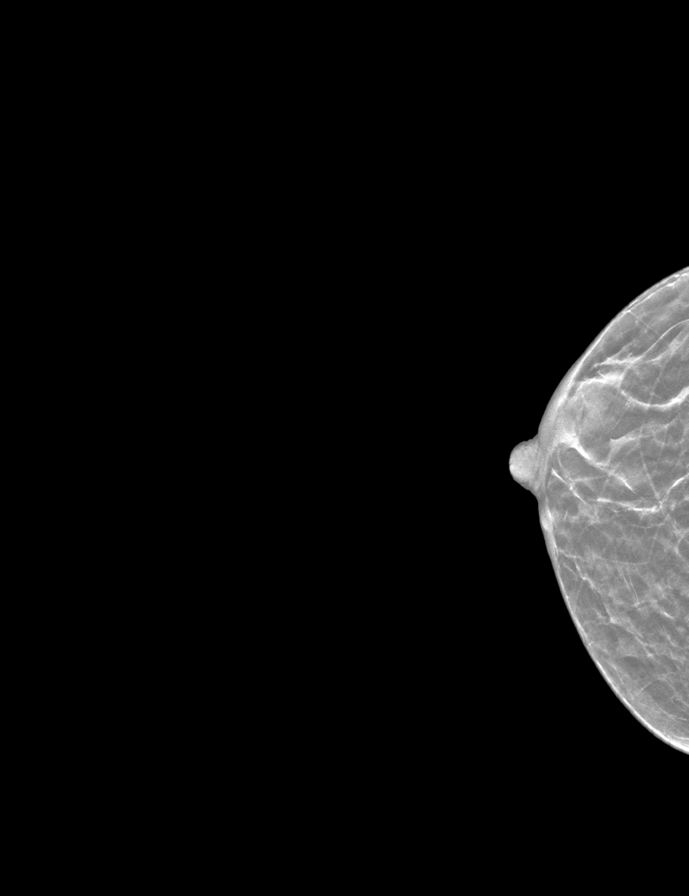

[R MLO synth-2D]
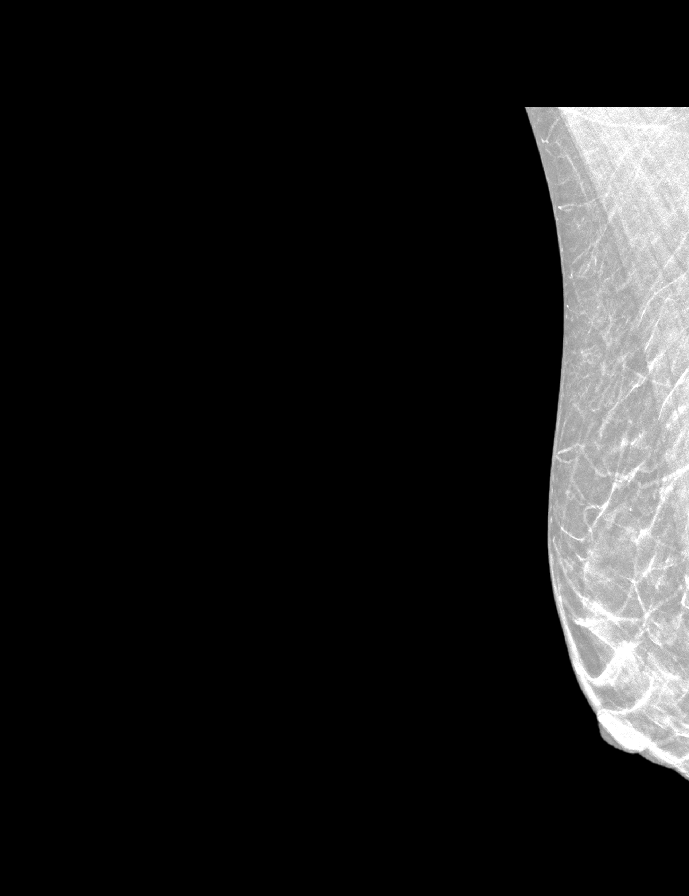

[L MLO synth-2D]
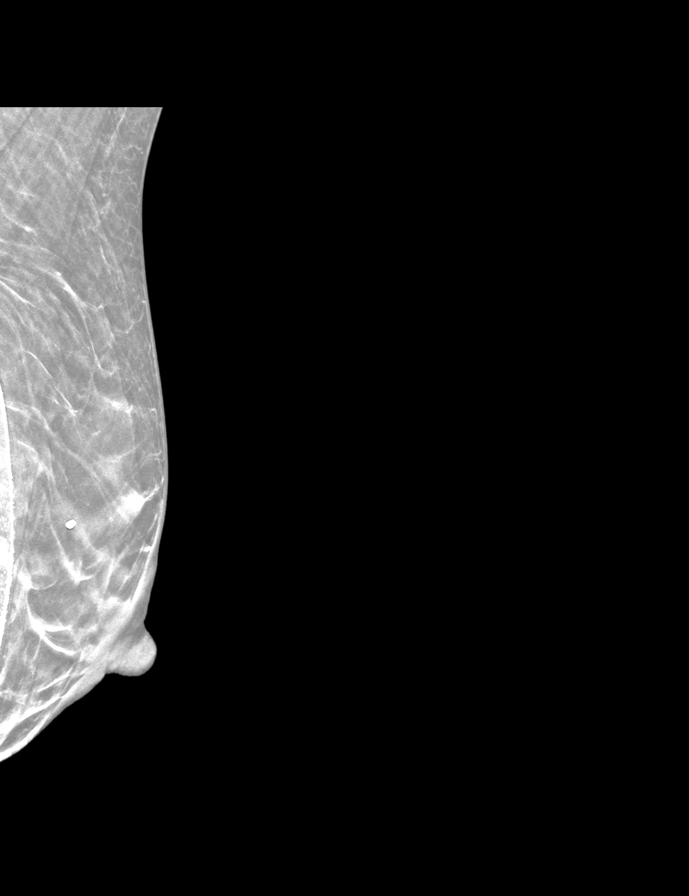

[L CC synth-2D]
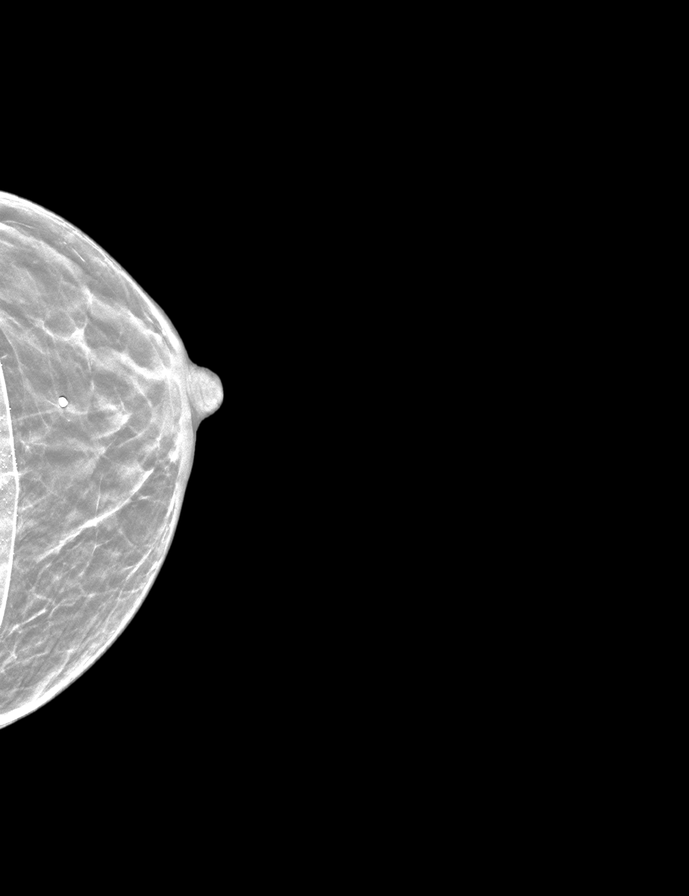

[R CC synth-2D (2 of 2)]
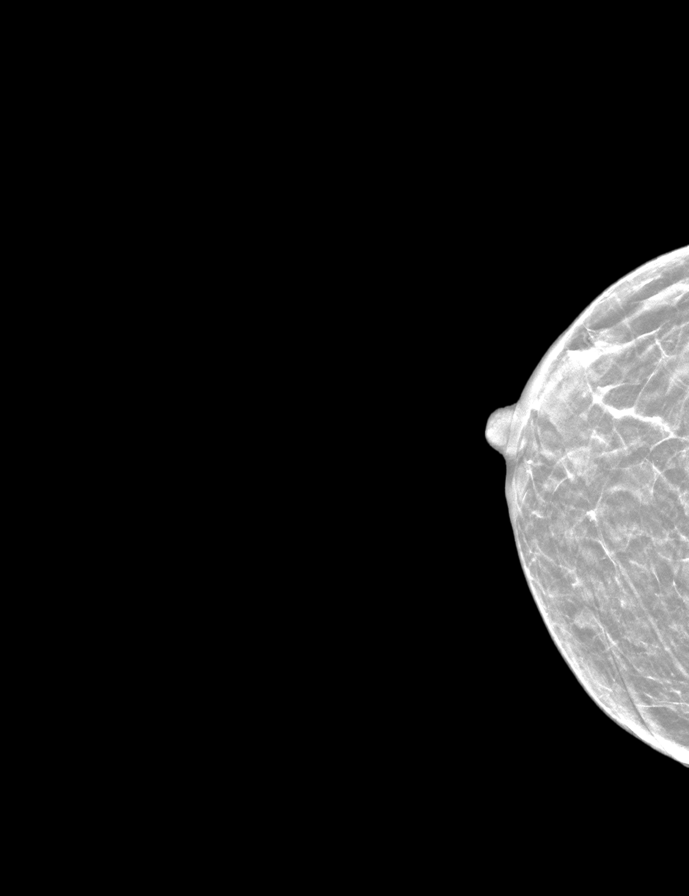

[9 of 34 positions shown; findings below may reference images not displayed]

ACR Breast Density Category c: The breast tissue is heterogeneously
dense, which may obscure small masses.
FINDINGS: The patient has retropectoral saline implants. There are no findings
suspicious for malignancy.
IMPRESSION: No mammographic evidence of malignancy. A result letter of this
screening mammogram will be mailed directly to the patient.

RECOMMENDATION:
Screening mammogram in one year. (Code:FQ-5-EOL)

BI-RADS CATEGORY  1:  Negative.

## 2023-08-11 ENCOUNTER — Ambulatory Visit: Payer: PPO | Admitting: Nurse Practitioner

## 2023-08-20 ENCOUNTER — Encounter: Payer: Self-pay | Admitting: Nurse Practitioner

## 2023-08-31 DIAGNOSIS — M47812 Spondylosis without myelopathy or radiculopathy, cervical region: Secondary | ICD-10-CM | POA: Diagnosis not present

## 2023-08-31 DIAGNOSIS — M545 Low back pain, unspecified: Secondary | ICD-10-CM | POA: Diagnosis not present

## 2023-08-31 DIAGNOSIS — M5416 Radiculopathy, lumbar region: Secondary | ICD-10-CM | POA: Diagnosis not present

## 2023-08-31 DIAGNOSIS — Z79899 Other long term (current) drug therapy: Secondary | ICD-10-CM | POA: Diagnosis not present

## 2023-08-31 DIAGNOSIS — G894 Chronic pain syndrome: Secondary | ICD-10-CM | POA: Diagnosis not present

## 2023-09-09 ENCOUNTER — Other Ambulatory Visit: Payer: Self-pay | Admitting: Nurse Practitioner

## 2023-09-11 MED ORDER — IBUPROFEN 800 MG PO TABS: ORAL_TABLET | ORAL | 0 refills | Status: DC

## 2023-11-23 DIAGNOSIS — M5416 Radiculopathy, lumbar region: Secondary | ICD-10-CM | POA: Diagnosis not present

## 2023-11-23 DIAGNOSIS — M545 Low back pain, unspecified: Secondary | ICD-10-CM | POA: Diagnosis not present

## 2023-11-23 DIAGNOSIS — Z79899 Other long term (current) drug therapy: Secondary | ICD-10-CM | POA: Diagnosis not present

## 2023-11-23 DIAGNOSIS — M5412 Radiculopathy, cervical region: Secondary | ICD-10-CM | POA: Diagnosis not present

## 2023-11-23 DIAGNOSIS — M542 Cervicalgia: Secondary | ICD-10-CM | POA: Diagnosis not present

## 2023-11-23 DIAGNOSIS — M7918 Myalgia, other site: Secondary | ICD-10-CM | POA: Diagnosis not present

## 2023-11-23 DIAGNOSIS — G894 Chronic pain syndrome: Secondary | ICD-10-CM | POA: Diagnosis not present

## 2023-11-23 DIAGNOSIS — M47812 Spondylosis without myelopathy or radiculopathy, cervical region: Secondary | ICD-10-CM | POA: Diagnosis not present

## 2023-12-12 ENCOUNTER — Other Ambulatory Visit: Payer: Self-pay | Admitting: Nurse Practitioner

## 2023-12-12 DIAGNOSIS — Z0001 Encounter for general adult medical examination with abnormal findings: Secondary | ICD-10-CM

## 2023-12-12 DIAGNOSIS — I10 Essential (primary) hypertension: Secondary | ICD-10-CM

## 2023-12-12 NOTE — Telephone Encounter (Signed)
 Pt cancel her last appt

## 2024-01-07 ENCOUNTER — Encounter: Payer: Self-pay | Admitting: Nurse Practitioner

## 2024-01-08 MED ORDER — IBUPROFEN 800 MG PO TABS: ORAL_TABLET | ORAL | 2 refills | Status: DC

## 2024-02-14 ENCOUNTER — Encounter: Payer: Self-pay | Admitting: Nurse Practitioner

## 2024-02-14 ENCOUNTER — Ambulatory Visit: Payer: PPO | Admitting: Nurse Practitioner

## 2024-02-14 VITALS — BP 136/88 | HR 98 | Temp 98.5°F | Resp 16 | Ht 64.0 in | Wt 120.8 lb

## 2024-02-14 DIAGNOSIS — M4802 Spinal stenosis, cervical region: Secondary | ICD-10-CM

## 2024-02-14 DIAGNOSIS — Z0000 Encounter for general adult medical examination without abnormal findings: Secondary | ICD-10-CM

## 2024-02-14 DIAGNOSIS — M5416 Radiculopathy, lumbar region: Secondary | ICD-10-CM | POA: Diagnosis not present

## 2024-02-14 DIAGNOSIS — G894 Chronic pain syndrome: Secondary | ICD-10-CM

## 2024-02-14 DIAGNOSIS — M1A00X0 Idiopathic chronic gout, unspecified site, without tophus (tophi): Secondary | ICD-10-CM

## 2024-02-14 DIAGNOSIS — T402X5A Adverse effect of other opioids, initial encounter: Secondary | ICD-10-CM

## 2024-02-14 DIAGNOSIS — H01004 Unspecified blepharitis left upper eyelid: Secondary | ICD-10-CM | POA: Diagnosis not present

## 2024-02-14 DIAGNOSIS — F411 Generalized anxiety disorder: Secondary | ICD-10-CM | POA: Diagnosis not present

## 2024-02-14 DIAGNOSIS — H01001 Unspecified blepharitis right upper eyelid: Secondary | ICD-10-CM

## 2024-02-14 DIAGNOSIS — G4700 Insomnia, unspecified: Secondary | ICD-10-CM | POA: Diagnosis not present

## 2024-02-14 DIAGNOSIS — M47812 Spondylosis without myelopathy or radiculopathy, cervical region: Secondary | ICD-10-CM | POA: Diagnosis not present

## 2024-02-14 DIAGNOSIS — I10 Essential (primary) hypertension: Secondary | ICD-10-CM

## 2024-02-14 DIAGNOSIS — Z79899 Other long term (current) drug therapy: Secondary | ICD-10-CM | POA: Diagnosis not present

## 2024-02-14 DIAGNOSIS — F410 Panic disorder [episodic paroxysmal anxiety] without agoraphobia: Secondary | ICD-10-CM | POA: Diagnosis not present

## 2024-02-14 DIAGNOSIS — Z23 Encounter for immunization: Secondary | ICD-10-CM | POA: Diagnosis not present

## 2024-02-14 DIAGNOSIS — K5903 Drug induced constipation: Secondary | ICD-10-CM | POA: Diagnosis not present

## 2024-02-14 DIAGNOSIS — M545 Low back pain, unspecified: Secondary | ICD-10-CM | POA: Diagnosis not present

## 2024-02-14 DIAGNOSIS — M7912 Myalgia of auxiliary muscles, head and neck: Secondary | ICD-10-CM | POA: Diagnosis not present

## 2024-02-14 MED ORDER — AMLODIPINE BESYLATE 10 MG PO TABS: 10.0000 mg | ORAL_TABLET | Freq: Every day | ORAL | 0 refills | Status: AC

## 2024-02-14 MED ORDER — TRAZODONE HCL 100 MG PO TABS: 100.0000 mg | ORAL_TABLET | Freq: Every day | ORAL | 0 refills | Status: AC

## 2024-02-14 MED ORDER — MIRTAZAPINE 30 MG PO TABS: 30.0000 mg | ORAL_TABLET | Freq: Every day | ORAL | 0 refills | Status: AC

## 2024-02-14 MED ORDER — COLCHICINE 0.6 MG PO TABS: ORAL_TABLET | ORAL | 0 refills | Status: AC

## 2024-02-14 MED ORDER — XDEMVY 0.25 % OP SOLN: 1.0000 [drp] | Freq: Two times a day (BID) | OPHTHALMIC | 1 refills | Status: AC

## 2024-02-14 MED ORDER — PNEUMOCOCCAL 20-VAL CONJ VACC 0.5 ML IM SUSY: 0.5000 mL | PREFILLED_SYRINGE | Freq: Once | INTRAMUSCULAR | 0 refills | Status: AC | PRN

## 2024-02-14 MED ORDER — IBUPROFEN 800 MG PO TABS: ORAL_TABLET | ORAL | 0 refills | Status: AC

## 2024-02-14 MED ORDER — PRUCALOPRIDE SUCCINATE 2 MG PO TABS
2.0000 mg | ORAL_TABLET | Freq: Every day | ORAL | 5 refills | Status: AC
Start: 2024-02-14 — End: ?

## 2024-02-14 NOTE — Progress Notes (Signed)
 [facility] [address] [place], [place] [address]  Internal MEDICINE  Office Visit Note  Patient Name: Phyllis Murphy  [id number]  [id number]  Date of Service: 02/14/2024  Chief Complaint  Patient presents with   Hypertension   Medicare Wellness    HPI Phyllis Murphy presents for a medicare annual wellness visit.  Well-appearing 58 y.o. female with chronic pain syndrome, hypertension, high cholesterol, anxiety, insomnia, neck pain, gout, demodex blepharitis, and chronic constipation. Routine CRC screening: due in August, will get this done in [place] .  Routine mammogram: plans to get doen in [place]   DEXA scan: not due yet  Labs: will have labs done in [place]   New or worsening pain: has chronic pain, is transferring care to [place]   Other concerns: previosuly moved to [place], the drive back to [place] is causing too much pain. She has decided to move her medical care to [place]  officially.      02/14/2024    9:43 AM 02/13/2023   10:53 AM  MMSE - Mini Mental State Exam  Orientation to time 5 5  Orientation to Place 5 5  Registration 3 3  Attention/ Calculation 5 5  Recall 3 3  Language- name 2 objects 2 2  Language- repeat 1 1  Language- follow 3 step command 3 3  Language- read & follow direction 1 1  Write a sentence 0 0  Copy design 1 1  Total score 29 29    Functional Status Survey: Is the patient deaf or have difficulty hearing?: Yes Does the patient have difficulty seeing, even when wearing glasses/contacts?: No Does the patient have difficulty concentrating, remembering, or making decisions?: No Does the patient have difficulty walking or climbing stairs?: Yes Does the patient have difficulty dressing or bathing?: No Does the patient have difficulty doing errands alone such as visiting a doctor's office or shopping?: No     02/07/2022   11:08 AM 05/05/2022   11:34 AM 07/04/2022    9:51 AM 02/13/2023   10:51 AM 02/14/2024    9:42 AM  Fall Risk  Falls  in the past year? 0 0 0 0 0  Was there an injury with Fall?  0 0 0 0  Fall Risk Category Calculator  0 0 0 0  Fall Risk Category (Retired)  Low  Low     (RETIRED) Patient Fall Risk Level Low fall risk  Low fall risk  Low fall risk     Patient at Risk for Falls Due to No Fall Risks No Fall Risks No Fall Risks No Fall Risks No Fall Risks  Fall risk Follow up Falls evaluation completed  Falls evaluation completed  Falls evaluation completed  Falls evaluation completed Falls evaluation completed     Data saved with a previous flowsheet row definition       02/14/2024    9:42 AM  Depression screen PHQ 2/9  Decreased Interest 0  Down, Depressed, Hopeless 0  PHQ - 2 Score 0        Current Medication: Outpatient Encounter Medications as of 02/14/2024  Medication Sig Note   colchicine  0.6 MG tablet Take 1-2 tablet once daily as needed for gout flare    pneumococcal 20-valent conjugate vaccine (PREVNAR 20) 0.5 ML injection Inject 0.5 mLs into the muscle once as needed for up to 1 dose for immunization.    Prucalopride Succinate  (MOTEGRITY ) 2 MG TABS Take 1 tablet (2 mg total) by mouth daily.    traZODone  (DESYREL ) 100 MG tablet  Take 1-2 tablets (100-200 mg total) by mouth at bedtime.    amLODipine  (NORVASC ) 10 MG tablet Take 1 tablet (10 mg total) by mouth daily.    cyclobenzaprine (FLEXERIL) 10 MG tablet Take 10 mg by mouth 4 (four) times daily.  04/07/2015: Received from: External Pharmacy   diphenhydrAMINE (BENADRYL) 25 mg capsule Take 25 mg by mouth every 6 (six) hours as needed.    ibuprofen  (ADVIL ) 800 MG tablet tAKE 1 TABLET BY MOUTH TWICE A DAY WITH FOOD AS NEEDED    mirtazapine  (REMERON ) 30 MG tablet Take 1 tablet (30 mg total) by mouth at bedtime.    oxyCODONE-acetaminophen (PERCOCET) 7.5-325 MG tablet TAKE 1 TABLET BY MOUTH TWICE A DAY FOR TOTAL DOSE OF 7.5/5/7.5/5. TO FILL 01-15-16. 01/21/2016: Received from: External Pharmacy   triamcinolone  cream (KENALOG ) 0.1 % Apply 1 Application  topically 2 (two) times daily as needed (rash/itching).    [DISCONTINUED] amLODipine  (NORVASC ) 10 MG tablet TAKE 1 TABLET BY MOUTH EVERY DAY    [DISCONTINUED] ibuprofen  (ADVIL ) 800 MG tablet tAKE 1 TABLET BY MOUTH TWICE A DAY WITH FOOD AS NEEDED    [DISCONTINUED] mirtazapine  (REMERON ) 30 MG tablet Take 1 tablet (30 mg total) by mouth at bedtime.    [DISCONTINUED] traZODone  (DESYREL ) 50 MG tablet TAKE 1 TO 2 TABLETS BY MOUTH DAILY AT BEDTIME AS NEEDED FOR INSOMNIA    No facility-administered encounter medications on file as of 02/14/2024.    Surgical History: Past Surgical History:  Procedure Laterality Date   ABDOMINAL HYSTERECTOMY     AUGMENTATION MAMMAPLASTY Bilateral 2004   BREAST ENHANCEMENT SURGERY     CERVICAL FUSION     ELBOW SURGERY     two surgeries 6 yrs ago 2017   trigger finger release surgery Right 09/30/2021    Medical History: Past Medical History:  Diagnosis Date   Anxiety    DDD (degenerative disc disease), cervical    History of breast augmentation    2007   Hx of hysterectomy    Hypertension    IDA (iron  deficiency anemia) 10/15/2021    Family History: Family History  Problem Relation Age of Onset   Brain cancer Mother    Hypertension Father    Diabetes Father    Stroke Father    Breast cancer Neg Hx     Social History   Socioeconomic History   Marital status: Married    Spouse name: Not on file   Number of children: Not on file   Years of education: Not on file   Highest education level: Not on file  Occupational History   Occupation: [employer] started 2019  Tobacco Use   Smoking status: Every Day    Current packs/day: 0.00    Average packs/day: 1 pack/day for 40.2 years (40.2 ttl pk-yrs)    Types: E-cigarettes, Cigarettes    Start date: 08/22/1980    Last attempt to quit: 11/20/2020    Years since quitting: 3.2   Smokeless tobacco: Never   Tobacco comments:    Vapes sometimes, pt reports no longer smokes cigarettes, but does vape some.  Back to  smoking  Vaping Use   Vaping status: Every Day   Devices: no nictonie  Substance and Sexual Activity   Alcohol use: No    Alcohol/week: 0.0 standard drinks of alcohol   Drug use: No   Sexual activity: Yes  Other Topics Concern   Not on file  Social History Narrative   ** Merged History Encounter **  Social Drivers of Corporate investment banker Strain: Not on file  Food Insecurity: Not on file  Transportation Needs: Not on file  Physical Activity: Not on file  Stress: Not on file  Social Connections: Not on file  Intimate Partner Violence: Not on file      Review of Systems  Constitutional:  Negative for activity change, appetite change, chills, fatigue, fever and unexpected weight change.  HENT: Negative.  Negative for congestion, ear pain, rhinorrhea, sore throat and trouble swallowing.   Eyes: Negative.   Respiratory: Negative.  Negative for cough, chest tightness, shortness of breath and wheezing.   Cardiovascular: Negative.  Negative for chest pain.  Gastrointestinal: Negative.  Negative for abdominal pain, blood in stool, constipation, diarrhea, nausea and vomiting.  Endocrine: Negative.   Genitourinary: Negative.  Negative for difficulty urinating, dysuria, frequency, hematuria and urgency.  Musculoskeletal: Negative.  Negative for arthralgias, back pain, joint swelling, myalgias and neck pain.  Skin: Negative.  Negative for rash and wound.  Allergic/Immunologic: Negative.  Negative for immunocompromised state.  Neurological: Negative.  Negative for dizziness, seizures, numbness and headaches.  Hematological: Negative.   Psychiatric/Behavioral: Negative.  Negative for behavioral problems, self-injury and suicidal ideas. The patient is not nervous/anxious.     Vital Signs: BP 136/88   Pulse (!) 113   Temp 98.5 F (36.9 C)   Resp 16   Ht 5' 4 (1.626 m)   Wt 120 lb 12.8 oz (54.8 kg)   SpO2 98%   BMI 20.74 kg/m    Physical Exam Vitals reviewed.   Constitutional:      General: She is not in acute distress.    Appearance: Normal appearance. She is well-developed and normal weight. She is not ill-appearing or diaphoretic.  HENT:     Head: Normocephalic and atraumatic.     Right Ear: Tympanic membrane, ear canal and external ear normal.     Left Ear: Tympanic membrane, ear canal and external ear normal.     Nose: Nose normal. No congestion or rhinorrhea.     Mouth/Throat:     Mouth: Mucous membranes are moist.     Pharynx: Oropharynx is clear. No oropharyngeal exudate or posterior oropharyngeal erythema.   Eyes:     Extraocular Movements: Extraocular movements intact.     Conjunctiva/sclera: Conjunctivae normal.     Pupils: Pupils are equal, round, and reactive to light.   Neck:     Thyroid: No thyromegaly.     Vascular: No JVD.     Trachea: No tracheal deviation.   Cardiovascular:     Rate and Rhythm: Normal rate and regular rhythm.     Pulses: Normal pulses.     Heart sounds: Normal heart sounds, S1 normal and S2 normal. No murmur heard.    No friction rub. No gallop.  Pulmonary:     Effort: Pulmonary effort is normal. No accessory muscle usage or respiratory distress.     Breath sounds: Normal breath sounds and air entry. No decreased breath sounds, wheezing or rales.  Chest:     Chest wall: No tenderness.     Comments: declined clinical breast exam. Last mammogram was normal and patient declined mammogram this year and said she will get it done next year.  Abdominal:     General: Abdomen is flat. Bowel sounds are normal. There is no distension.     Palpations: Abdomen is soft.     Tenderness: There is no abdominal tenderness.   Musculoskeletal:  General: Normal range of motion.     Cervical back: Normal range of motion and neck supple.     Right lower leg: No edema.     Left lower leg: No edema.  Lymphadenopathy:     Cervical: No cervical adenopathy.   Skin:    General: Skin is warm and dry.      Capillary Refill: Capillary refill takes less than 2 seconds.     Coloration: Skin is not jaundiced.     Findings: No bruising.   Neurological:     Mental Status: She is alert and oriented to person, place, and time.     Cranial Nerves: No cranial nerve deficit.     Coordination: Coordination normal.     Gait: Gait normal.   Psychiatric:        Mood and Affect: Mood normal.        Behavior: Behavior normal.        Thought Content: Thought content normal.        Judgment: Judgment normal.        Assessment/Plan: 1. Encounter for subsequent annual wellness visit (AWV) in Medicare patient (Primary) Age-appropriate preventive screenings and vaccinations discussed. Routine labs for health maintenance not ordered, will have labs done when she establishes with new PCP in [place] . PHM updated.   - ibuprofen  (ADVIL ) 800 MG tablet; tAKE 1 TABLET BY MOUTH TWICE A DAY WITH FOOD AS NEEDED  Dispense: 60 tablet; Refill: 0  2. Essential hypertension Stable continue amlodipine  as prescribed.  - amLODipine  (NORVASC ) 10 MG tablet; Take 1 tablet (10 mg total) by mouth daily.  Dispense: 90 tablet; Refill: 0  3. Opioid-induced constipation Motegrity  prescribed for constipation  - Prucalopride Succinate  (MOTEGRITY ) 2 MG TABS; Take 1 tablet (2 mg total) by mouth daily.  Dispense: 30 tablet; Refill: 5  4. 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  - Lotilaner  (XDEMVY ) 0.25 % SOLN; Place 1 drop into both eyes in the morning and at bedtime.  Dispense: 10 mL; Refill: 1  5. Idiopathic chronic gout without tophus, unspecified site Continue colchicine  as needed.  - colchicine  0.6 MG tablet; Take 1-2 tablet once daily as needed for gout flare  Dispense: 60 tablet; Refill: 0  6. Cervical spinal stenosis Sees pain management specialist.   7. Chronic pain syndrome Sees pain management specialist.   8. Mixed  insomnia Continue trazodone  and mirtazapine  as prescribed.  - mirtazapine  (REMERON ) 30 MG tablet; Take 1 tablet (30 mg total) by mouth at bedtime.  Dispense: 90 tablet; Refill: 0 - traZODone  (DESYREL ) 100 MG tablet; Take 1-2 tablets (100-200 mg total) by mouth at bedtime.  Dispense: 180 tablet; Refill: 0  9. Need for vaccination - pneumococcal 20-valent conjugate vaccine (PREVNAR 20) 0.5 ML injection; Inject 0.5 mLs into the muscle once as needed for up to 1 dose for immunization.  Dispense: 0.5 mL; Refill: 0  10. Generalized anxiety disorder with panic attacks Continue mirtazapine  as prescribed.  - mirtazapine  (REMERON ) 30 MG tablet; Take 1 tablet (30 mg total) by mouth at bedtime.  Dispense: 90 tablet; Refill: 0      General Counseling: Geraldy verbalizes understanding of the findings of todays visit and agrees with plan of treatment. I have discussed any further diagnostic evaluation that may be needed or ordered today. We also reviewed her medications today. she has been encouraged to call the office with any questions or concerns  that should arise related to todays visit.    No orders of the defined types were placed in this encounter.   Meds ordered this encounter  Medications   colchicine  0.6 MG tablet    Sig: Take 1-2 tablet once daily as needed for gout flare    Dispense:  60 tablet    Refill:  0    Fill new script today   Prucalopride Succinate  (MOTEGRITY ) 2 MG TABS    Sig: Take 1 tablet (2 mg total) by mouth daily.    Dispense:  30 tablet    Refill:  5    Fill new script today   mirtazapine  (REMERON ) 30 MG tablet    Sig: Take 1 tablet (30 mg total) by mouth at bedtime.    Dispense:  90 tablet    Refill:  0    Refill today for 90 days   traZODone  (DESYREL ) 100 MG tablet    Sig: Take 1-2 tablets (100-200 mg total) by mouth at bedtime.    Dispense:  180 tablet    Refill:  0    Fill new script today   amLODipine  (NORVASC ) 10 MG tablet    Sig: Take 1 tablet (10 mg  total) by mouth daily.    Dispense:  90 tablet    Refill:  0    Fill for 90 days   ibuprofen  (ADVIL ) 800 MG tablet    Sig: tAKE 1 TABLET BY MOUTH TWICE A DAY WITH FOOD AS NEEDED    Dispense:  60 tablet    Refill:  0   pneumococcal 20-valent conjugate vaccine (PREVNAR 20) 0.5 ML injection    Sig: Inject 0.5 mLs into the muscle once as needed for up to 1 dose for immunization.    Dispense:  0.5 mL    Refill:  0    Prevnar 20    Return for patient is transferring her medical care to [place] , no follow up needed. .   Total time spent:30 Minutes Time spent includes review of chart, medications, test results, and follow up plan with the patient.   [place] Controlled Substance Database was reviewed by me.  This patient was seen by Mardy Maxin, FNP-C in collaboration with Dr. Sigrid Bathe as a part of collaborative care agreement.  Nochum Fenter R. Maxin, MSN, FNP-C Internal medicine

## 2024-02-25 ENCOUNTER — Encounter: Payer: Self-pay | Admitting: Nurse Practitioner

## 2024-03-07 ENCOUNTER — Other Ambulatory Visit: Payer: Self-pay | Admitting: Nurse Practitioner

## 2024-03-07 DIAGNOSIS — M1A00X0 Idiopathic chronic gout, unspecified site, without tophus (tophi): Secondary | ICD-10-CM

## 2024-04-30 ENCOUNTER — Other Ambulatory Visit: Payer: Self-pay | Admitting: Nurse Practitioner

## 2024-04-30 DIAGNOSIS — Z0000 Encounter for general adult medical examination without abnormal findings: Secondary | ICD-10-CM

## 2025-02-14 ENCOUNTER — Ambulatory Visit: Admitting: Nurse Practitioner
